# Patient Record
Sex: Female | Born: 1975
Health system: Southern US, Community
[De-identification: ages and names within clinical notes are randomized; demographics above are authoritative.]

---

## 2019-10-18 IMAGING — CT CT ANGIO CHEST
2 of 6 series · 19 of 36 positions shown · IV contrast (Omnipaque)
Comparison: [DATE]

CLINICAL DATA: Sepsis, left lower back pain

EXAM:
CT ANGIOGRAPHY CHEST WITH CONTRAST
TECHNIQUE: Multidetector CT imaging of the chest was performed using the
standard protocol during bolus administration of intravenous
contrast. Multiplanar CT image reconstructions and MIPs were
obtained to evaluate the vascular anatomy.
CONTRAST:  100mL OMNIPAQUE IOHEXOL 350 MG/ML SOLN

[Series 4: pe thins · axial · 0.69mm/px · z∈[-358,-76]mm · 18 of 314 slices shown]
[im 16/314  lung]
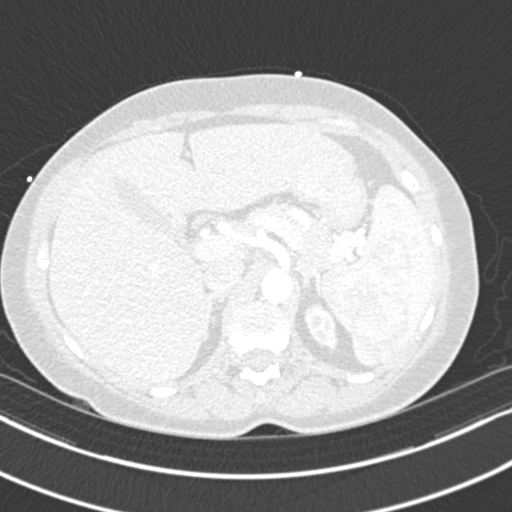
[im 32/314  mediastinal]
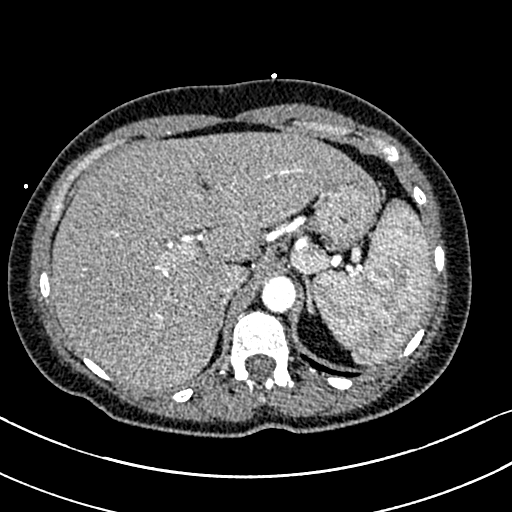
[im 47/314  lung]
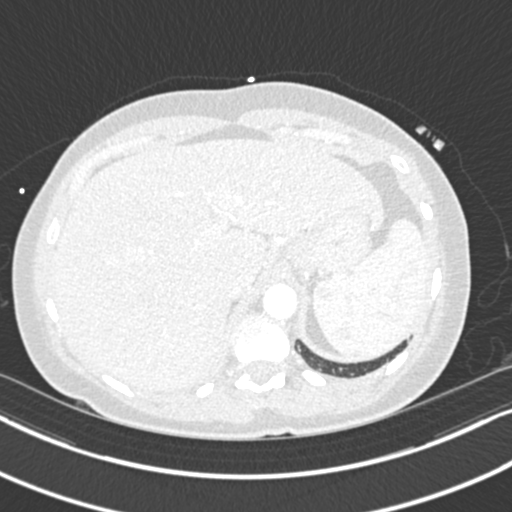
[im 63/314  mediastinal]
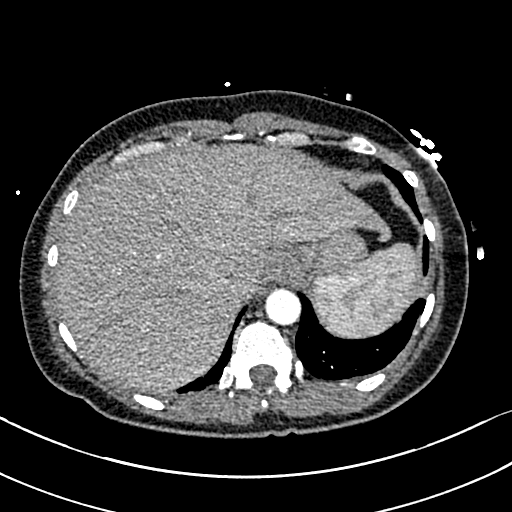
[im 79/314  lung]
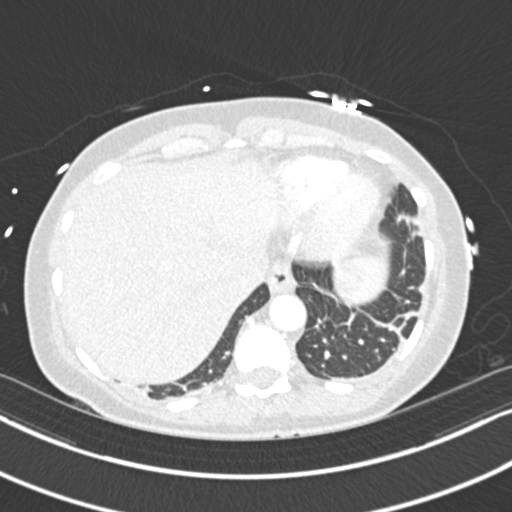
[im 94/314  mediastinal]
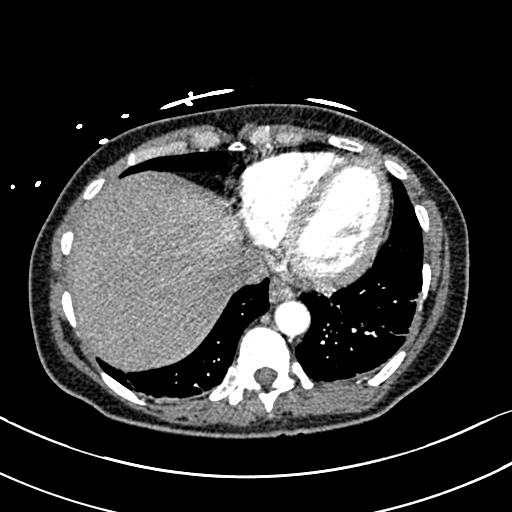
[im 110/314  lung]
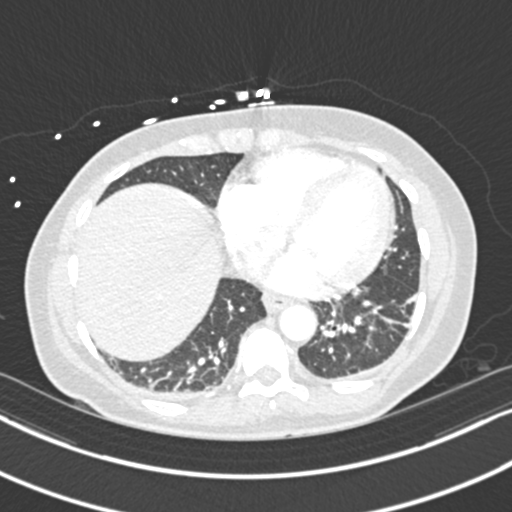
[im 126/314  mediastinal]
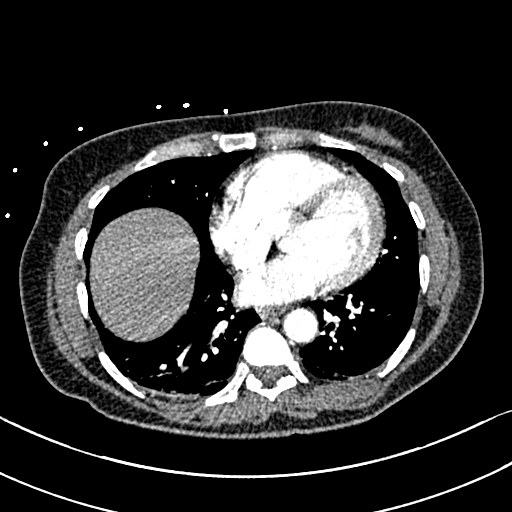
[im 141/314  lung]
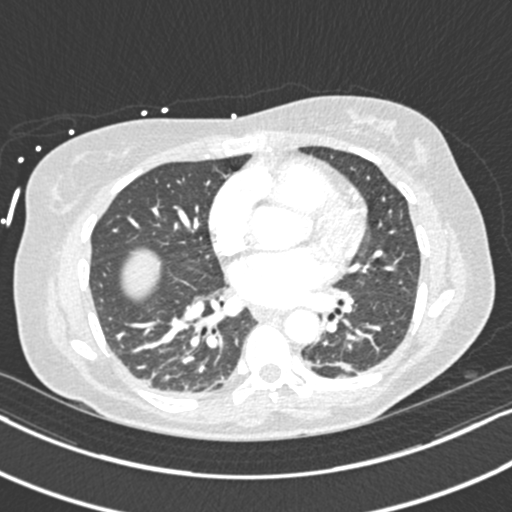
[im 173/314  mediastinal]
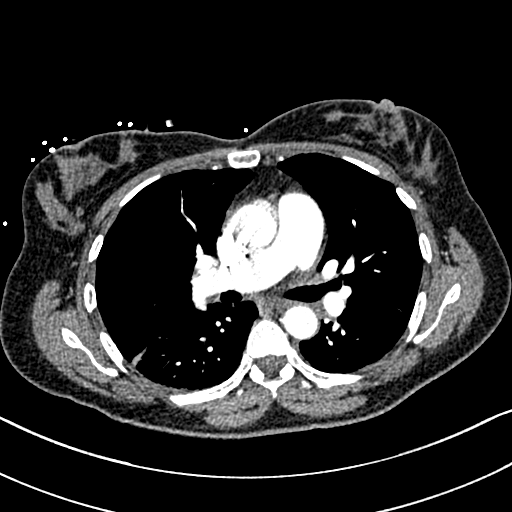
[im 188/314  lung]
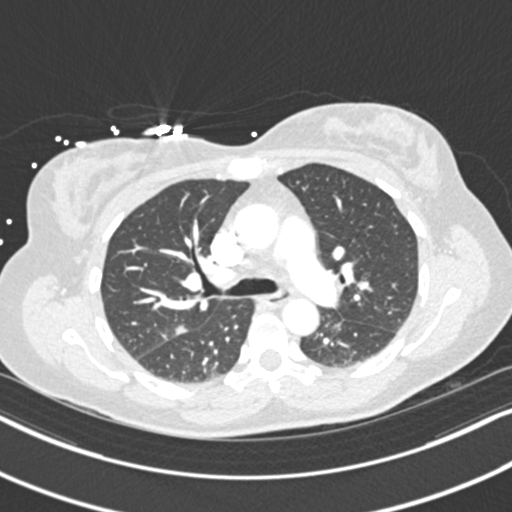
[im 204/314  mediastinal]
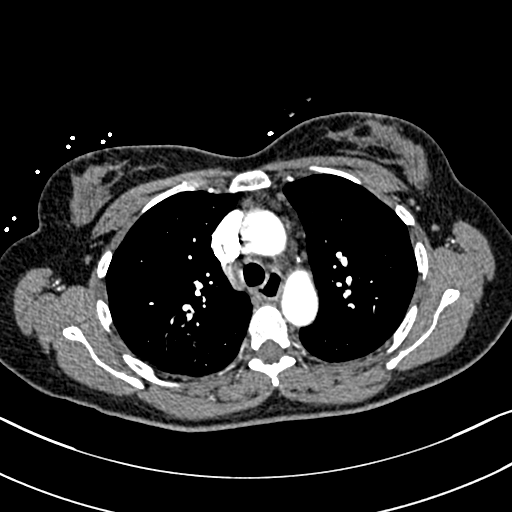
[im 220/314  lung]
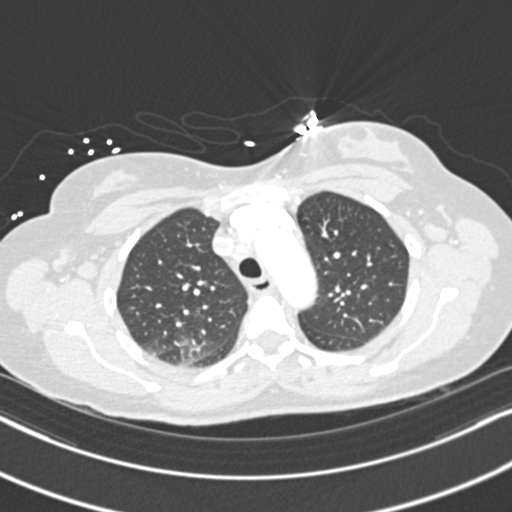
[im 235/314  mediastinal]
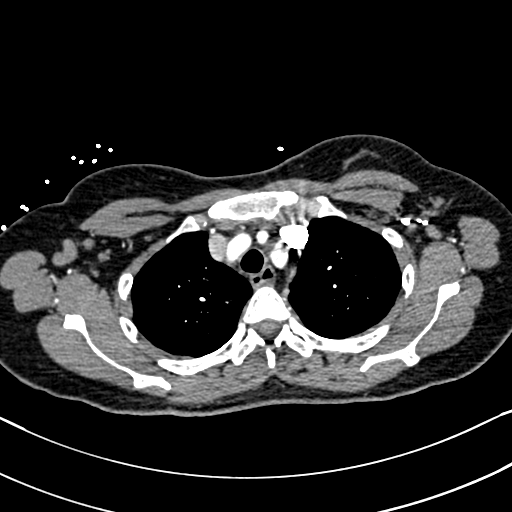
[im 251/314  lung]
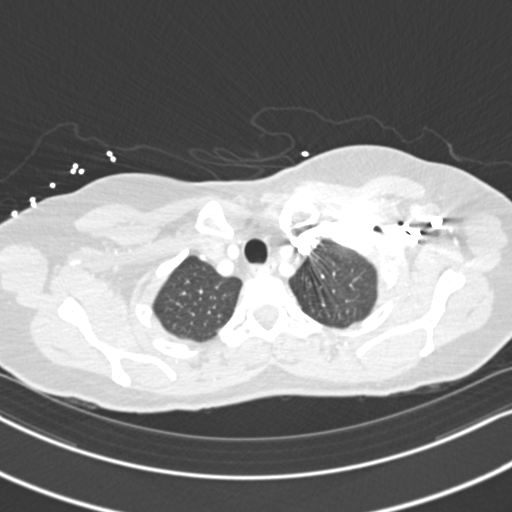
[im 267/314  mediastinal]
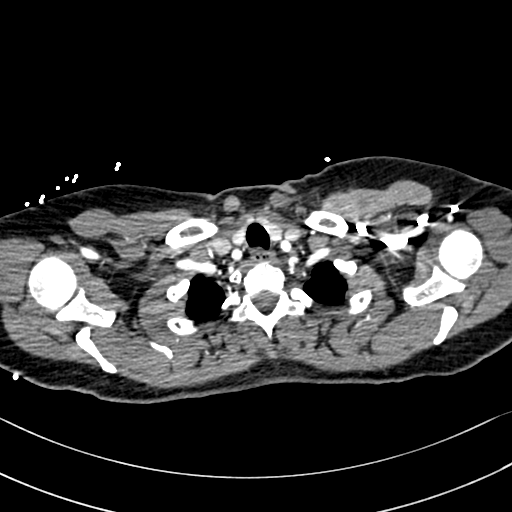
[im 282/314  lung]
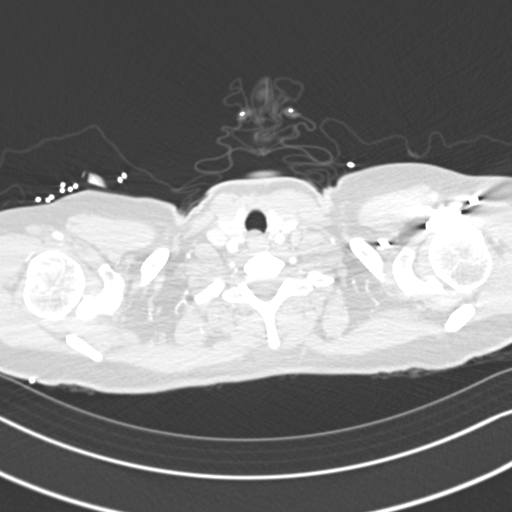
[im 298/314  mediastinal]
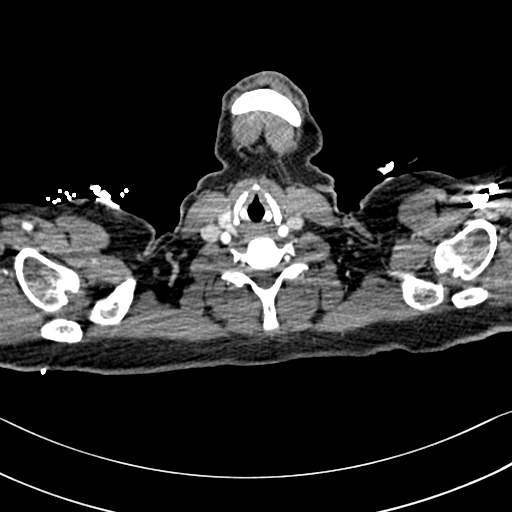

[Series 5: pe coronal mpr · coronal · 0.61mm/px · 1 of 125 slices shown]
[im 63/125  mediastinal]
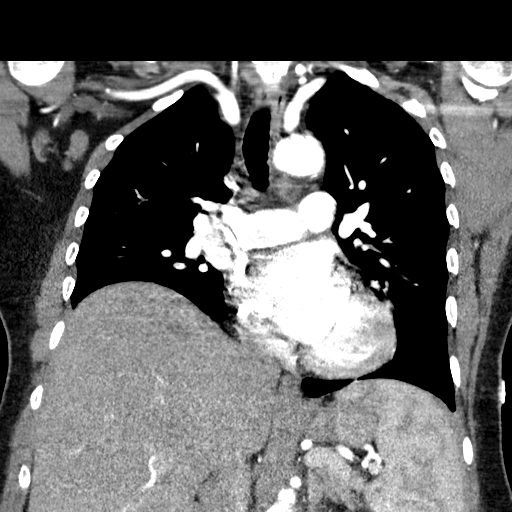

[19 of 36 positions shown; findings below may reference images not displayed]

FINDINGS: Cardiovascular: The heart is unremarkable without pericardial
effusion. Normal caliber of the thoracic aorta, with no evidence of
dissection. There are no filling defects or pulmonary emboli.

Mediastinum/Nodes: No enlarged mediastinal, hilar, or axillary lymph
nodes. Thyroid gland, trachea, and esophagus demonstrate no
significant findings.

Lungs/Pleura: There is patchy consolidation within the right upper
lobe along the major fissure, which could reflect hypoventilatory
change or focal infection/inflammation. Hypoventilatory changes are
seen at the lung bases. No effusion or pneumothorax. Central airways
are patent.

Upper Abdomen: No acute abnormality.

Musculoskeletal: No acute or destructive bony lesions. Reconstructed
images demonstrate no additional findings.

Review of the MIP images confirms the above findings.
IMPRESSION: 1. No evidence of pulmonary embolus.
2. Unremarkable thoracic aorta.
3. Minimal right upper lobe consolidation, which may reflect focal
inflammation/infection versus hypoventilatory change.

## 2019-10-18 IMAGING — MR MR THORACIC SPINE WO/W CM
5 of 9 series · 20 of 48 positions shown · IV contrast (gadavist)
Comparison: Prior CT from [DATE].

EXAM:
MRI THORACIC AND LUMBAR SPINE WITHOUT AND WITH CONTRAST
TECHNIQUE: Multiplanar and multiecho pulse sequences of the thoracic and lumbar
spine were obtained without and with intravenous contrast.

CONTRAST:  7.5mL GADAVIST GADOBUTROL 1 MMOL/ML IV SOLN

[Series 18: T1 · sagittal · 3.3mm · 0.62mm/px · 1 of 7 slices shown (1 of 3)]
[im 1/7]
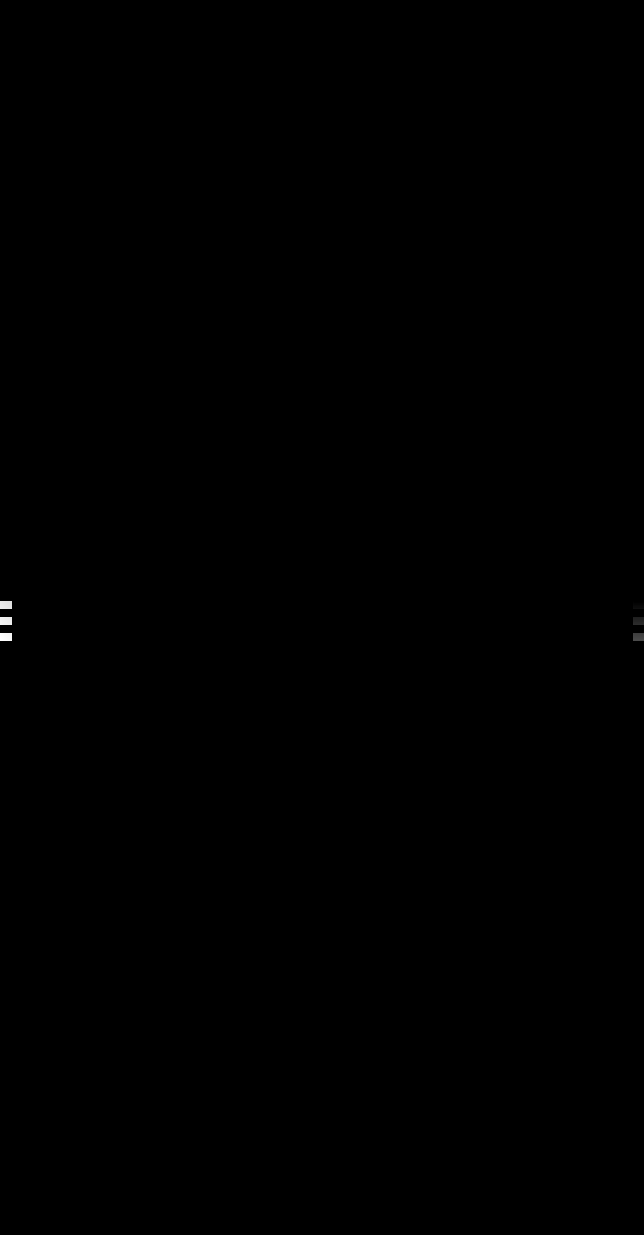

[Series 19: T2 · sagittal · 3.0mm · 0.76mm/px · 3 of 17 slices shown (1 of 2)]
[im 1/17]
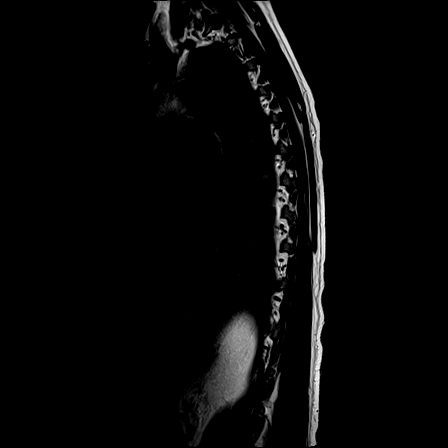
[im 9/17]
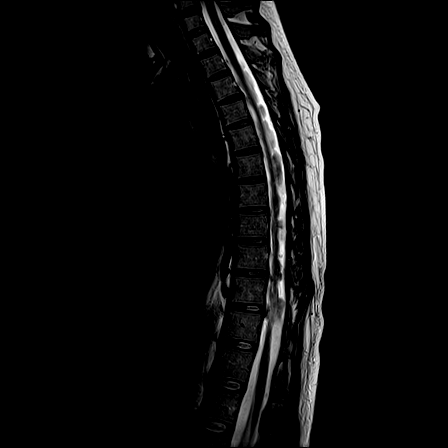
[im 17/17]
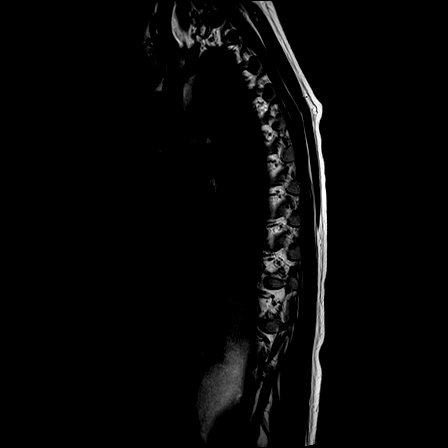

[Series 20: T1 · sagittal · 3.0mm · 0.76mm/px · 4 of 17 slices shown (2 of 3)]
[im 1/17]
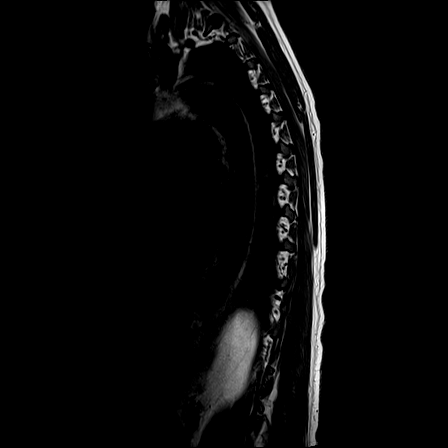
[im 6/17]
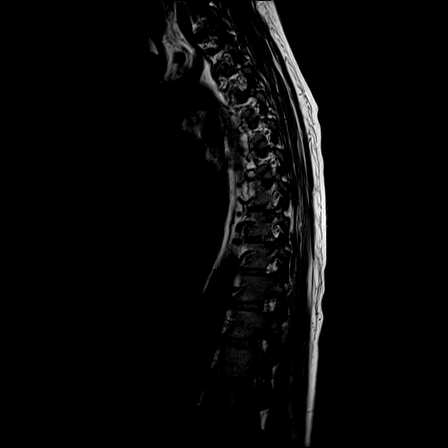
[im 11/17]
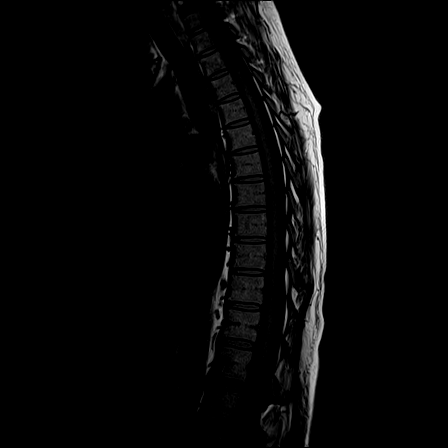
[im 17/17]
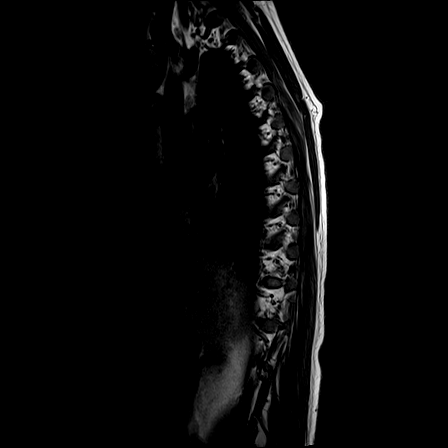

[Series 22: T2 · axial · 5.0mm · 0.59mm/px · z∈[-175,+57]mm · 8 of 39 slices shown (2 of 2)]
[im 1/39]
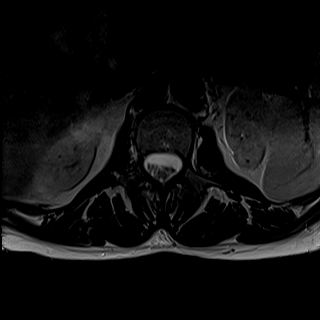
[im 6/39]
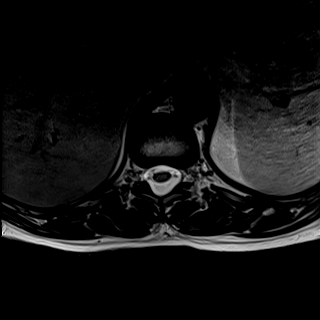
[im 11/39]
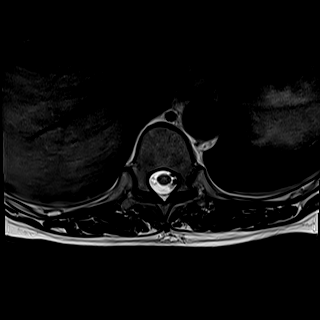
[im 17/39]
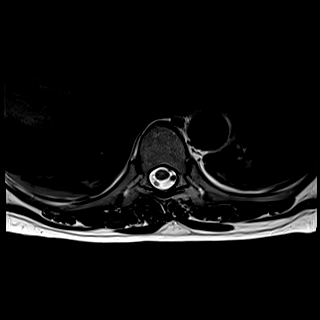
[im 22/39]
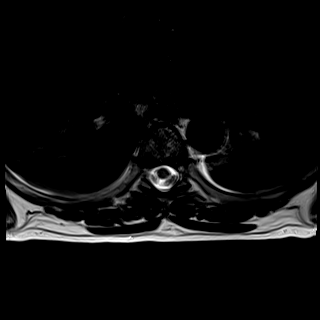
[im 28/39]
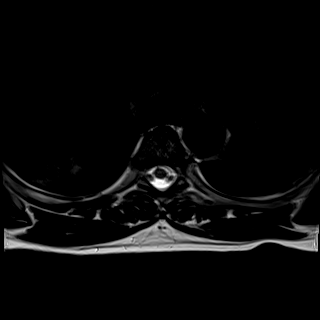
[im 33/39]
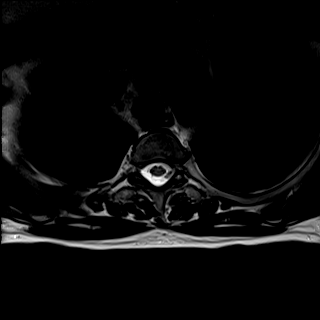
[im 39/39]
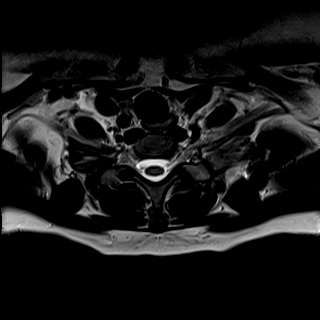

[Series 24: T1 · axial · non-contrast · 5.0mm · 0.31mm/px · z∈[-175,-47]mm · 4 of 39 slices shown (3 of 3)]
[im 1/39]
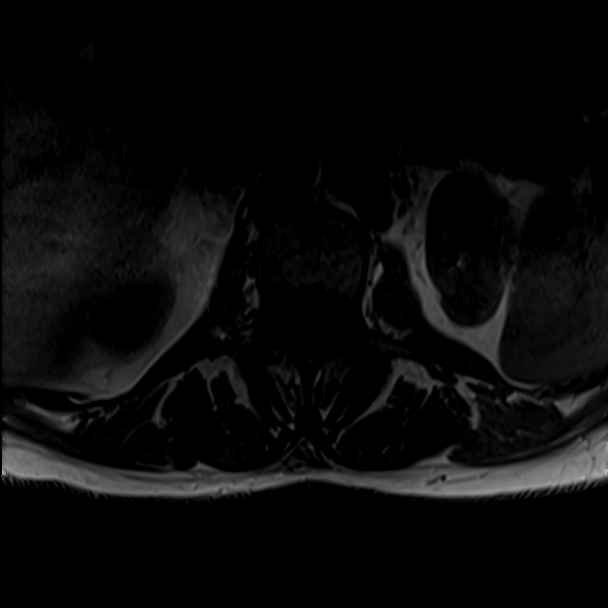
[im 6/39]
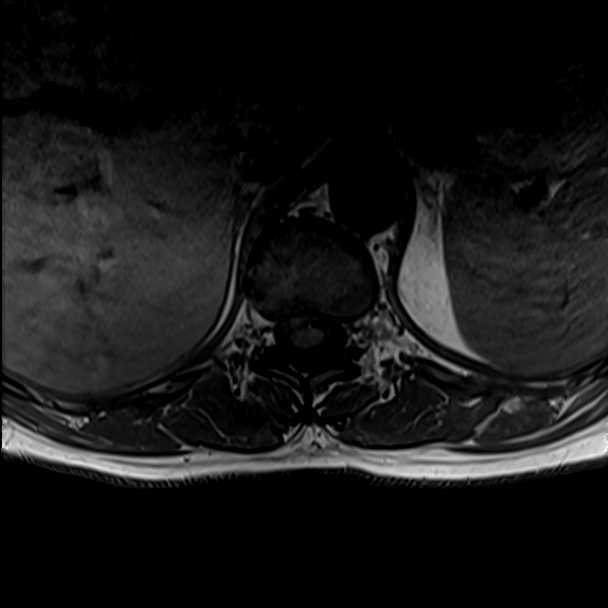
[im 11/39]
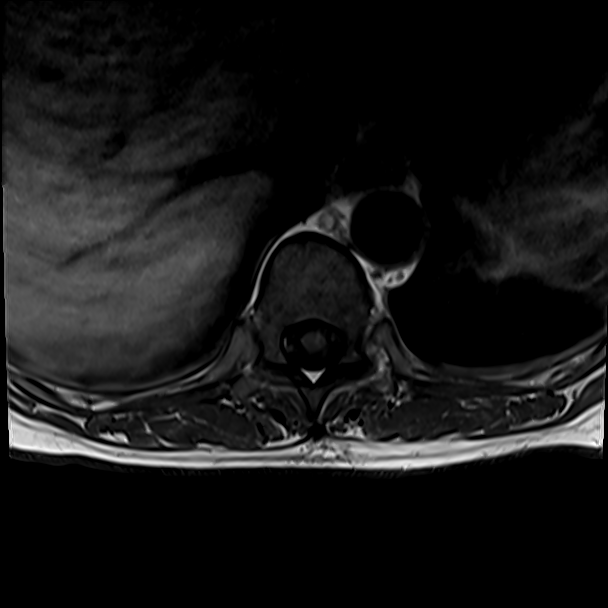
[im 17/39]
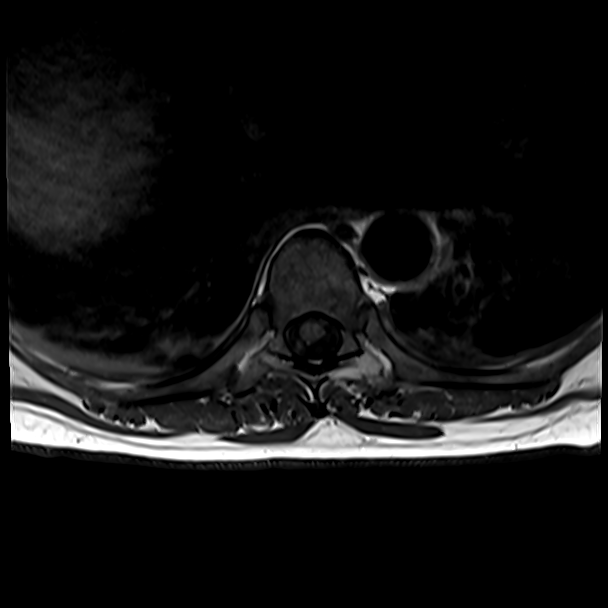

[20 of 48 positions shown; findings below may reference images not displayed]

FINDINGS: MRI THORACIC SPINE FINDINGS

Alignment: Physiologic with preservation of the normal thoracic
kyphosis. No listhesis.

Vertebrae: Vertebral body height maintained without acute or chronic
fracture. Bone marrow signal intensity within normal limits. No
discrete or worrisome osseous lesions. No abnormal marrow edema or
enhancement. No findings to suggest osteomyelitis discitis or septic
arthritis.

Cord: Normal signal and morphology. No epidural abscess or other
collection. No abnormal enhancement.

Paraspinal and other soft tissues: Paraspinous soft tissues
demonstrate no acute finding. Mild scattered atelectatic changes
noted within the visualized lungs. Associated trace layering
bilateral pleural effusions. 1 cm simple cyst noted within the
visualized left kidney. Visualized visceral structures otherwise
unremarkable.

Disc levels:

T3-4: Small central disc protrusion indents the ventral thecal sac.
No significant spinal stenosis or cord deformity.

No other significant disc pathology seen within the thoracic spine.
No other disc bulge or focal disc herniation. No significant
stenosis or evidence for neural impingement.

MRI LUMBAR SPINE FINDINGS

Segmentation: Standard. Lowest well-formed disc space labeled the
L5-S1 level.

Alignment:  Physiologic.  No listhesis.

Vertebrae: Vertebral body height maintained without acute or chronic
fracture. Abnormal marrow edema and enhancement seen about the left
sacroiliac joint, most consistent with septic arthritis given
provided history. Associated edema and enhancement within the
adjacent left iliacus and piriformis musculature. Small 1.3 cm early
abscess/collection seen along the anterior margin of the left SI
joint (series 4, image 27). No other so abscess or drainable fluid
collections identified.

No other evidence for acute infection within the lumbar spine.
Underlying bone marrow signal intensity within normal limits. No
worrisome osseous lesions.

Conus medullaris: Extends to the L1 level. Previously seen
thickening, clumping, and enhancement of the nerve roots of the
cauda equina is improved as compared to previous exam. However,
there has been interval development of multiple adhesive bands
traversing the distal thecal sac at the levels of L3 through the
sacrum. Mild enhancement seen along the most prominent of these
adhesive bands at the level of L5 (series 7, image 23), likely
reflecting fibrosis. Superiorly, the nerve roots of the cauda equina
are somewhat displaced posteriorly at the level of the conus,
similar to previous (series 4, image 5). Findings are likely
postinflammatory in nature related to prior infection.

Paraspinal and other soft tissues: Abnormal edema and enhancement
within the left iliacus markers filter adjacent to the left SI
joint. Mild edema and enhancement noted within the presacral space
as well. Paraspinous soft tissues demonstrate no other acute
finding. Few scattered simple cyst noted within the kidneys
bilaterally. Partially visualized urinary bladder is moderately
distended.

Disc levels:

L1-2:  Unremarkable.

L2-3:  Unremarkable.

L3-4: Disc desiccation with shallow central disc protrusion mildly
indents the ventral thecal sac. Associated small annular fissure. No
significant spinal stenosis. Foramina remain patent.

L4-5: Mild diffuse disc bulge with disc desiccation. Small central
annular fissure. Mild facet hypertrophy. No significant spinal
stenosis. Foramina remain patent.

L5-S1: Degenerative intervertebral disc space narrowing with disc
desiccation and reactive endplate changes. Prior laminotomy defect
noted on the right. No significant spinal stenosis. Foramina remain
patent.
IMPRESSION: 1. Findings consistent with septic arthritis involving the left
sacroiliac joint. Associated edema and enhancement within the
adjacent left iliacus and piriformis musculature with suspected
small 1.3 cm early periarticular abscess along the anterior margin
of the left SI joint.
2. No other evidence for acute infection within the thoracic or
lumbar spine. No epidural abscess or other collection.
3. Sequelae of prior infectious arachnoiditis with interval
development of multiple adhesive bands traversing the distal thecal
sac as above.
4. Mild degenerative disc disease at L3-4 through L5-S1 without
significant stenosis or neural impingement.

## 2019-10-18 IMAGING — DX DG CHEST 2V
2 series · 2 of 2 positions shown · non-contrast
Comparison: Chest radiograph [DATE] chest CT [DATE] at [facility]

CLINICAL DATA: Suspected sepsis.

EXAM:
CHEST - 2 VIEW

[chest lat]
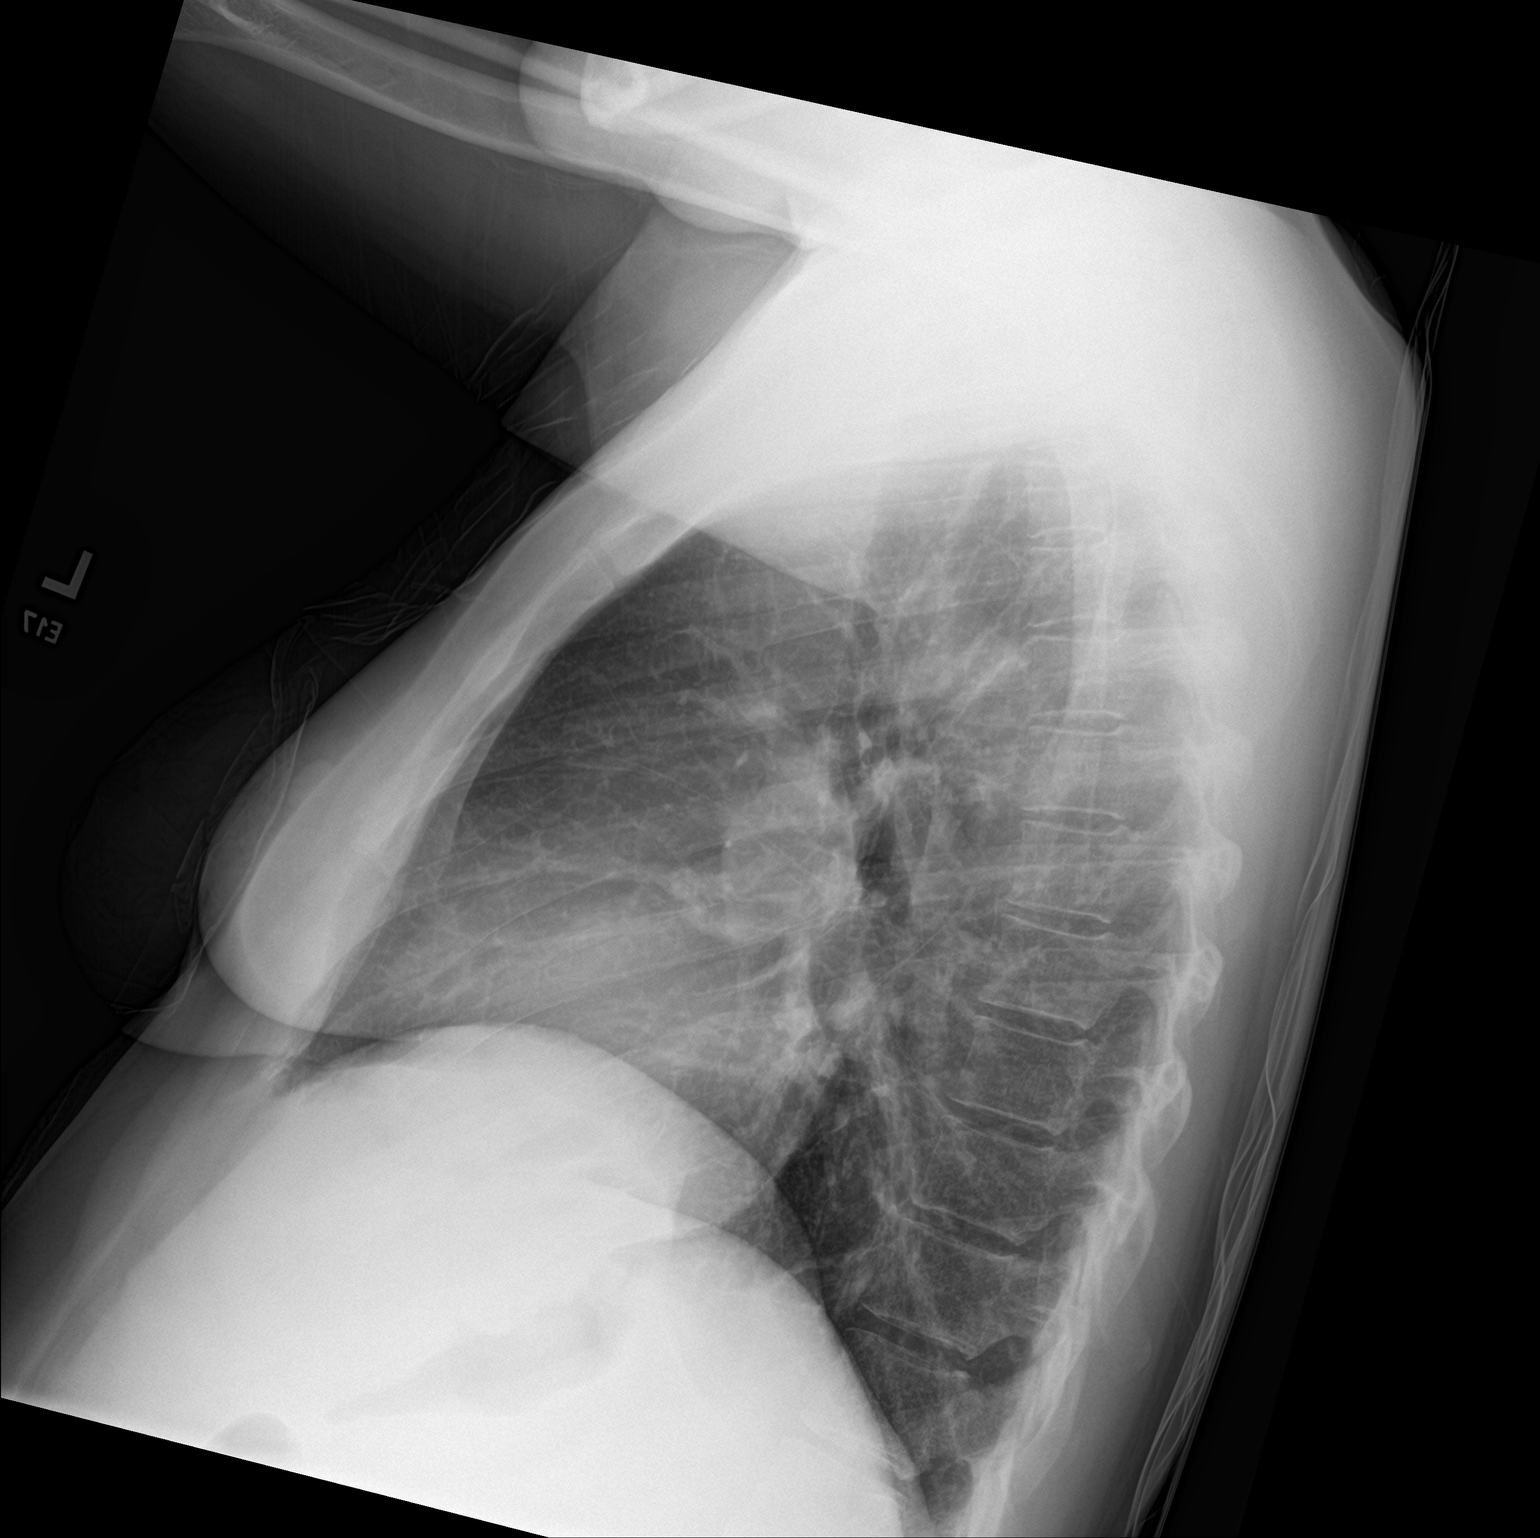

[chest ap]
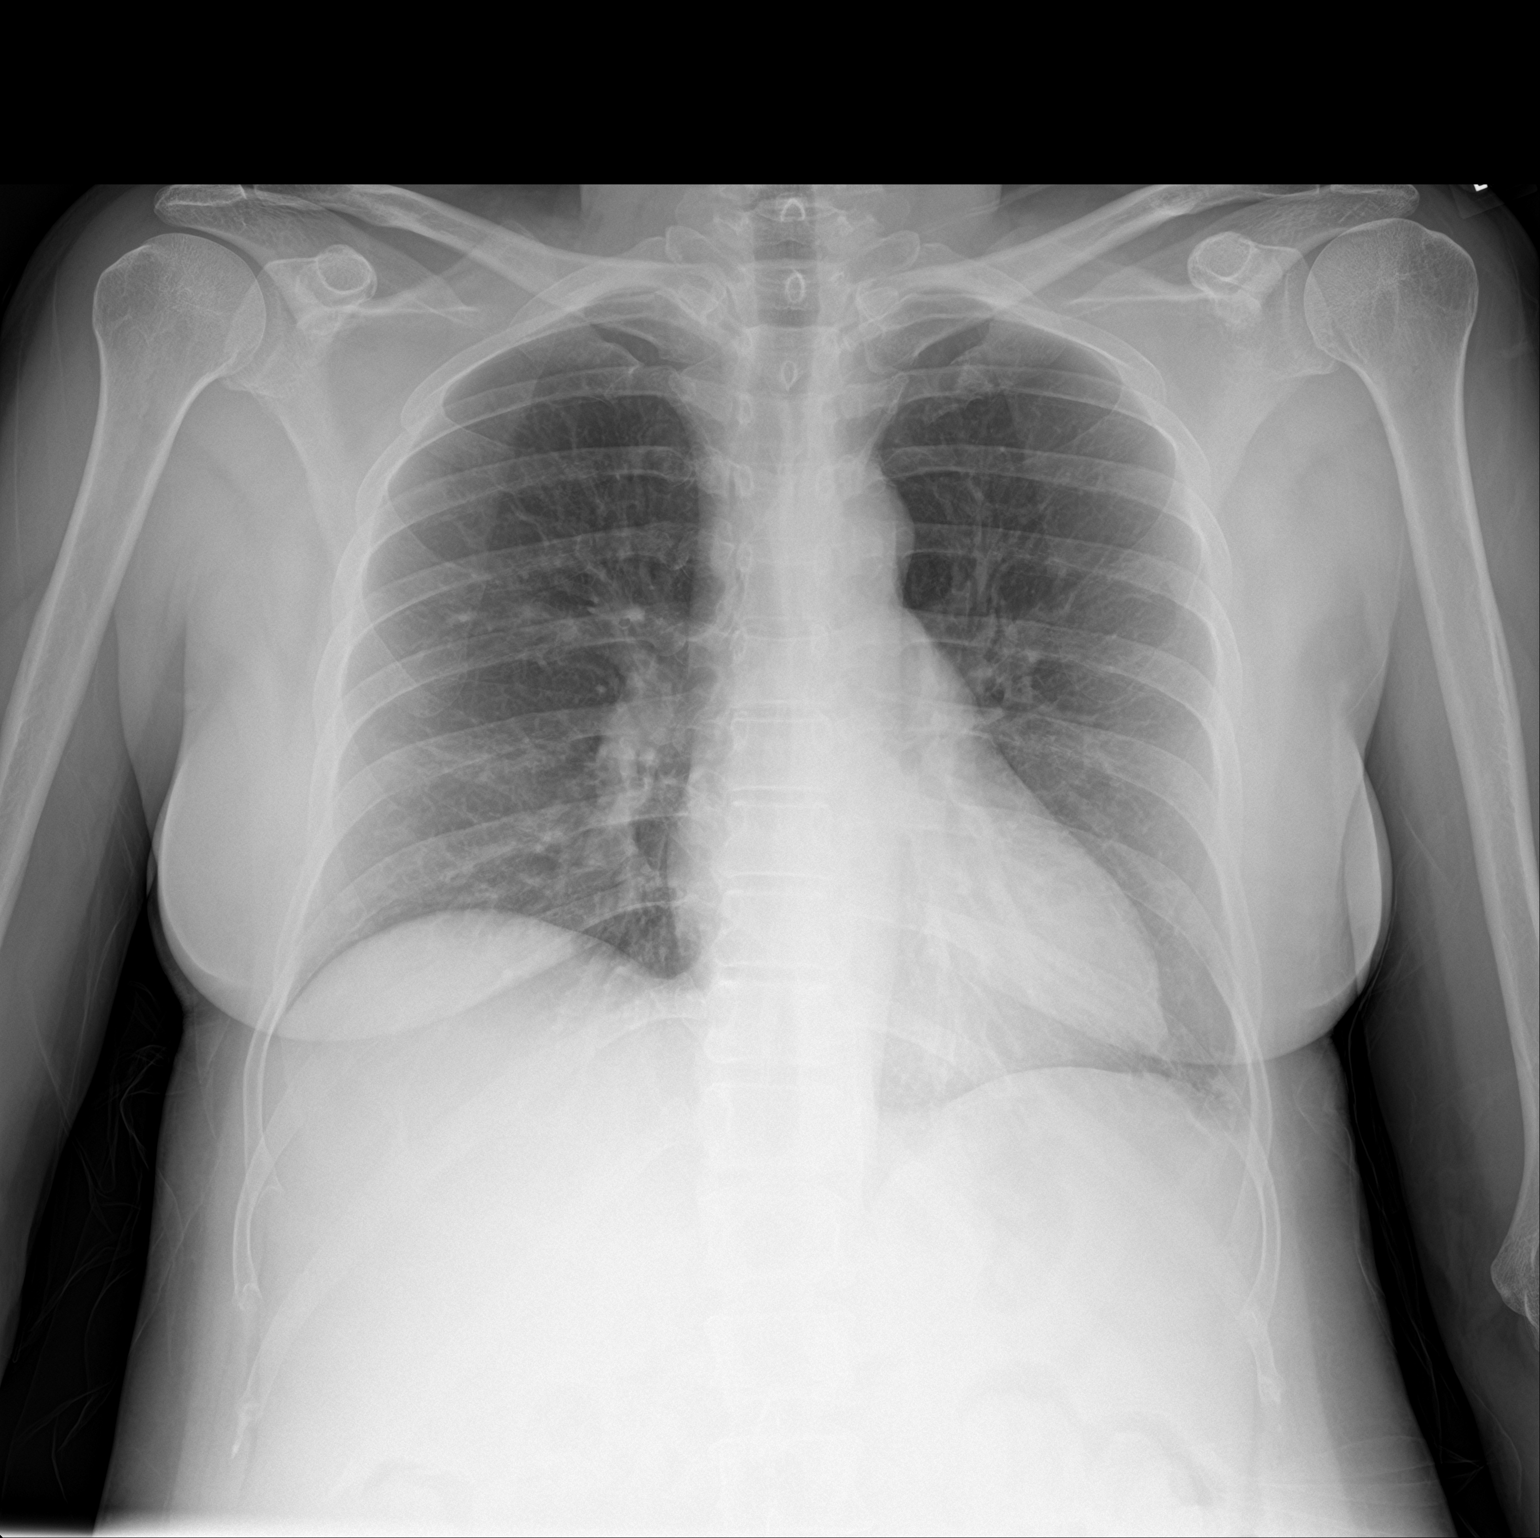

[2 of 2 positions shown; findings below may reference images not displayed]

FINDINGS: The cardiomediastinal contours are normal. Improved linear opacities
at the left lung base and lingula from prior exam. Pulmonary
vasculature is normal. No consolidation, pleural effusion, or
pneumothorax. No acute osseous abnormalities are seen.
IMPRESSION: No acute chest findings. Improved linear opacities in the left lung
base and lingula from prior exam.

## 2019-10-18 IMAGING — CT CT ABD-PELV W/ CM
2 of 7 series · 16 of 46 positions shown, 18 images · IV contrast (Omnipaque)
Comparison: [DATE]

CLINICAL DATA: Left lower quadrant abdominal pain, history of IV
drug abuse and Recurrent psoas abscess, low back pain with left
lower extremity radiculopathy

EXAM:
CT ABDOMEN AND PELVIS WITH CONTRAST
TECHNIQUE: Multidetector CT imaging of the abdomen and pelvis was performed
using the standard protocol following bolus administration of
intravenous contrast.
CONTRAST:  100mL OMNIPAQUE IOHEXOL 350 MG/ML SOLN

[Series 2: axial st · axial · 0.83mm/px · z∈[-690,-265]mm · 13 of 99 slices shown, 15 images]
[im 7/99  soft-tissue]
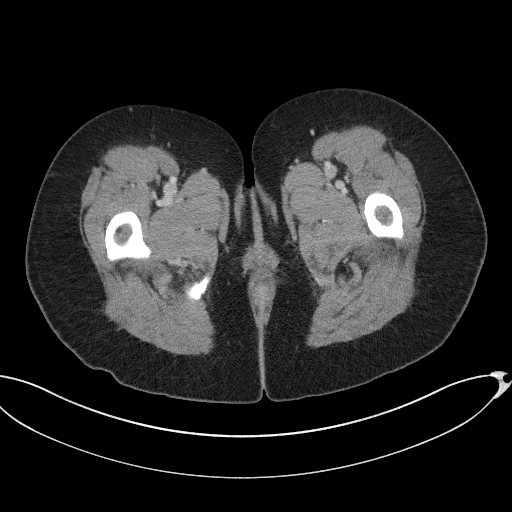
[im 7/99  bone]
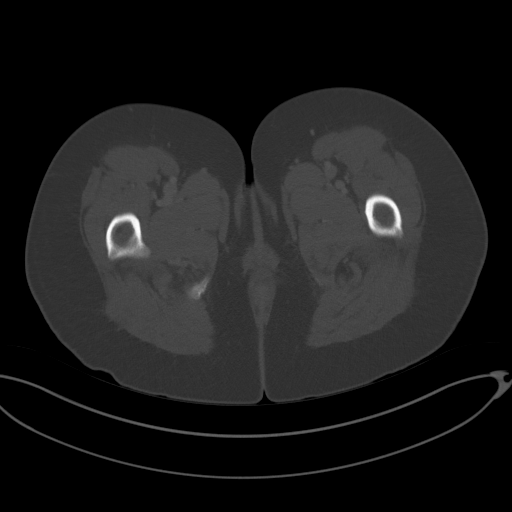
[im 13/99  soft-tissue]
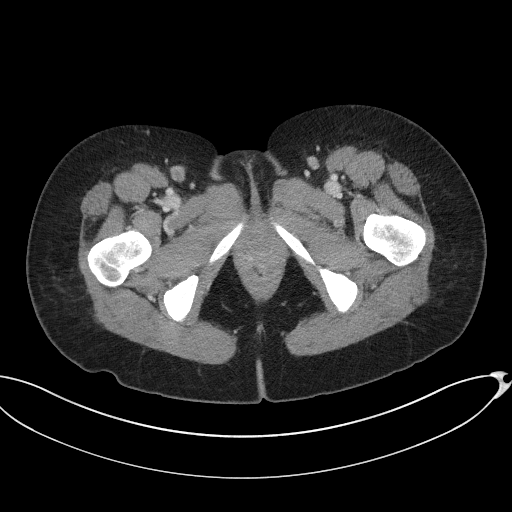
[im 19/99  soft-tissue]
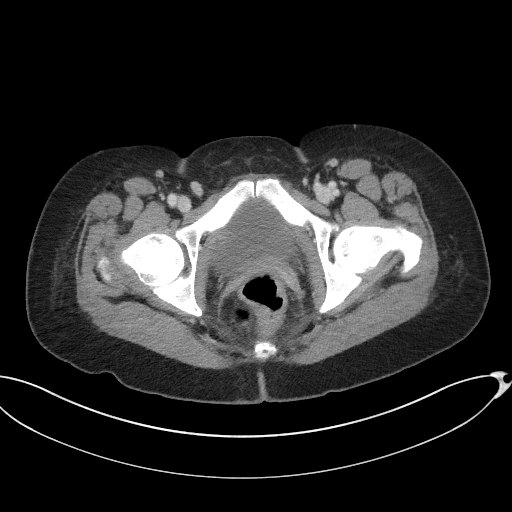
[im 31/99  soft-tissue]
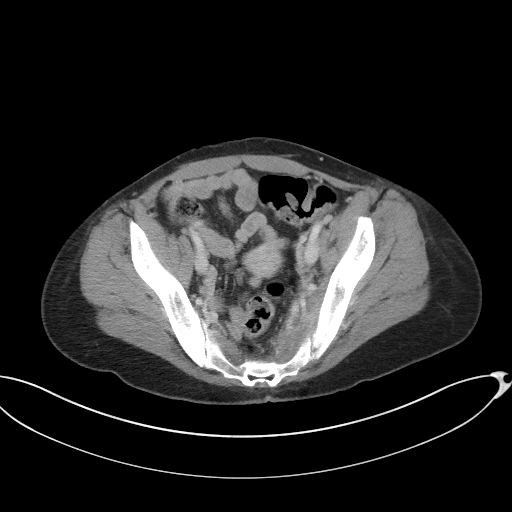
[im 37/99  soft-tissue]
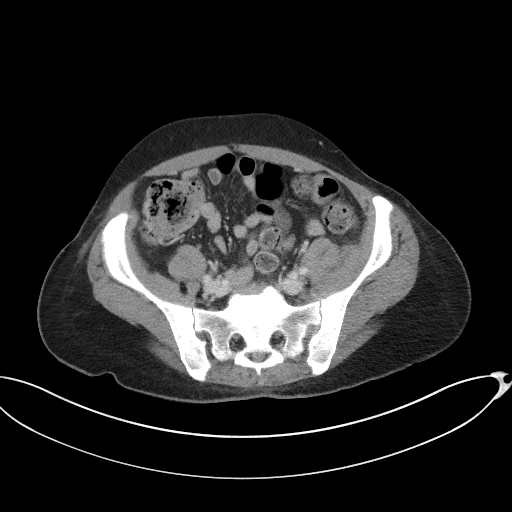
[im 43/99  soft-tissue]
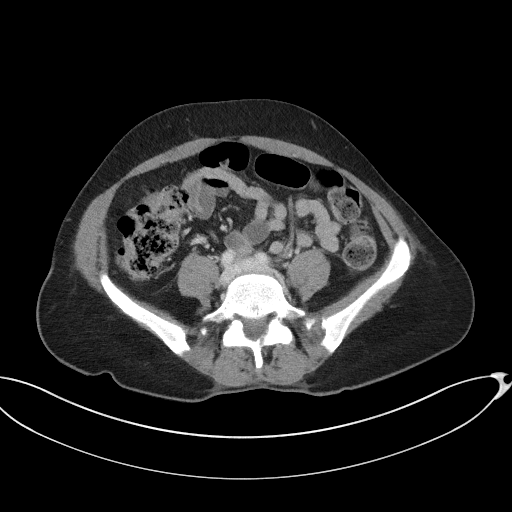
[im 50/99  soft-tissue]
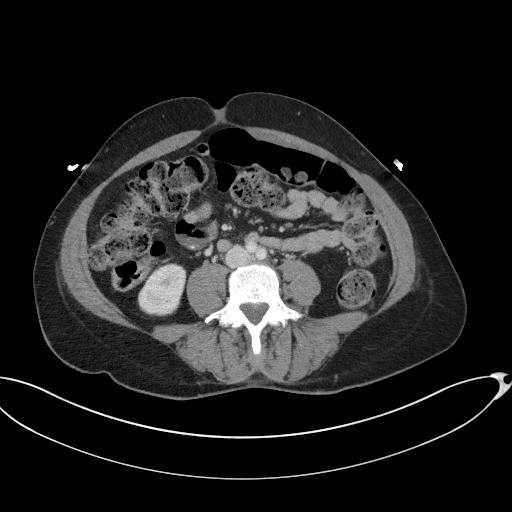
[im 56/99  soft-tissue]
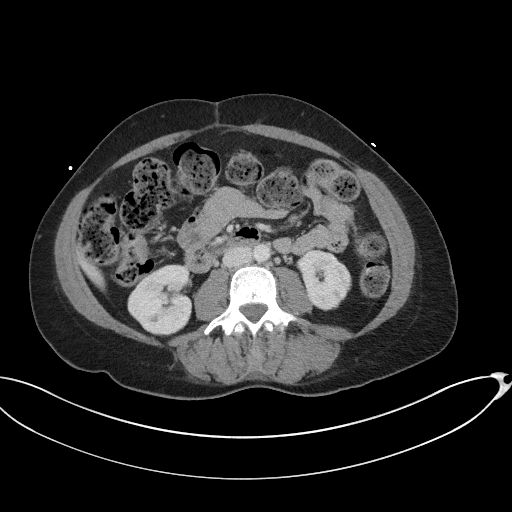
[im 62/99  soft-tissue]
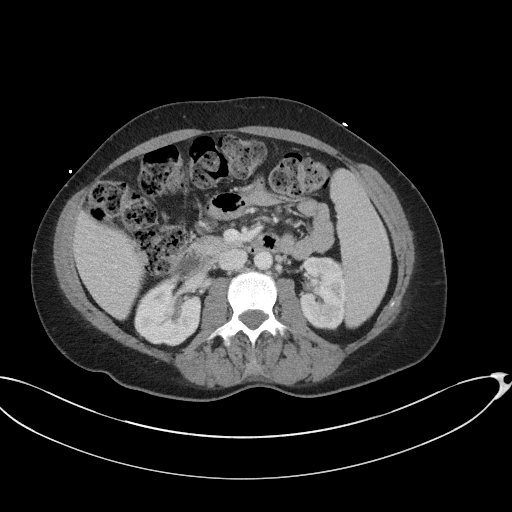
[im 62/99  bone]
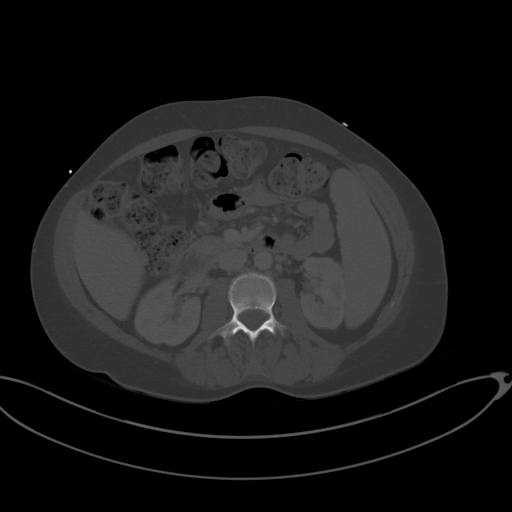
[im 68/99  soft-tissue]
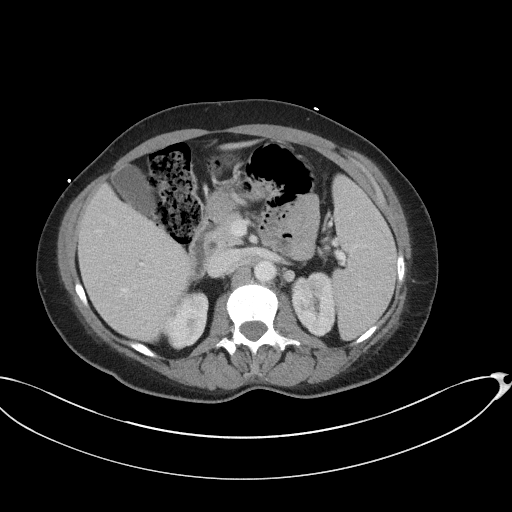
[im 80/99  soft-tissue]
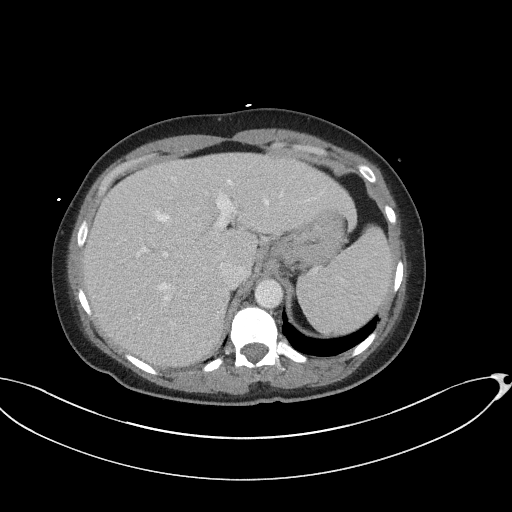
[im 86/99  soft-tissue]
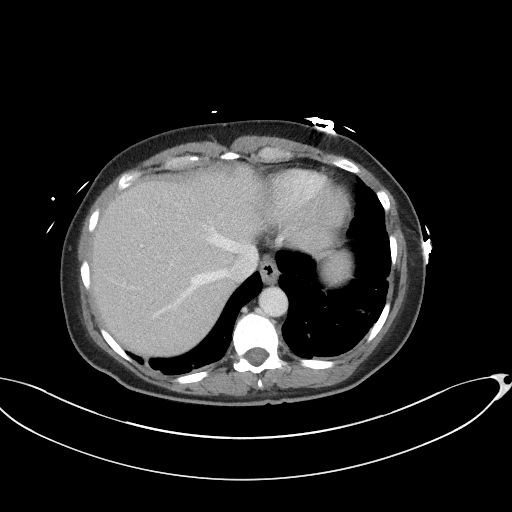
[im 92/99  soft-tissue]
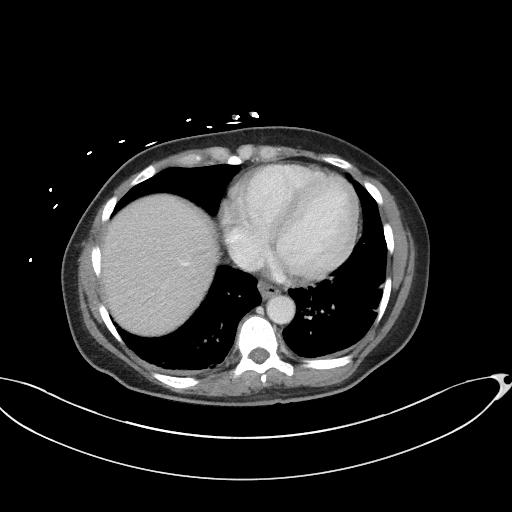

[Series 4: abd/pel coronal st · coronal · 0.76mm/px · 3 of 101 slices shown]
[im 34/101  soft-tissue]
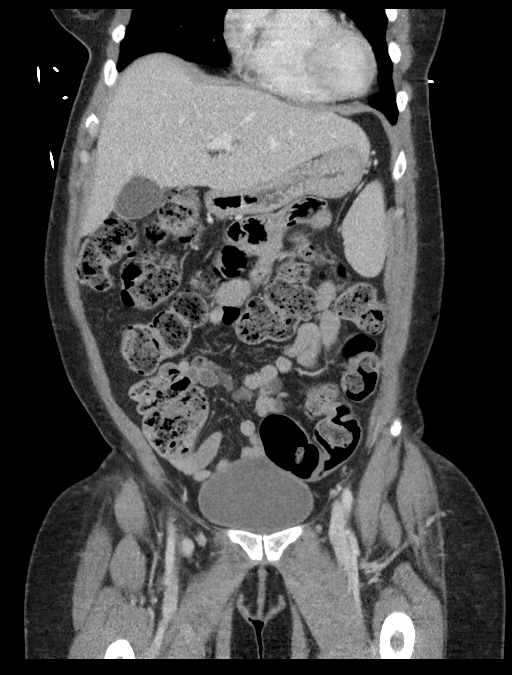
[im 51/101  soft-tissue]
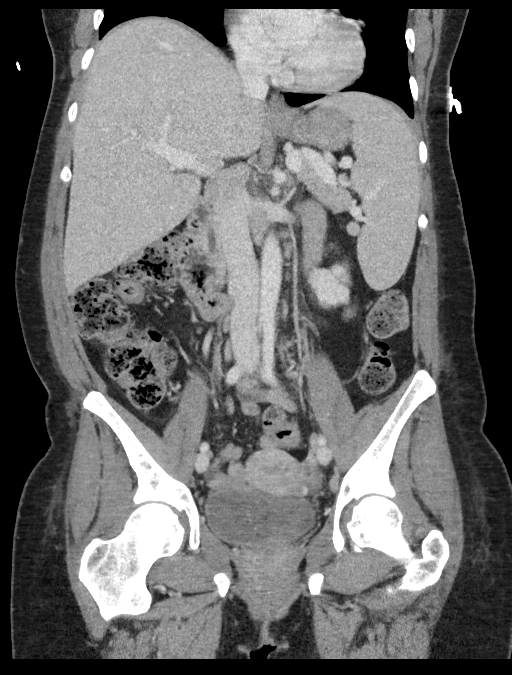
[im 67/101  soft-tissue]
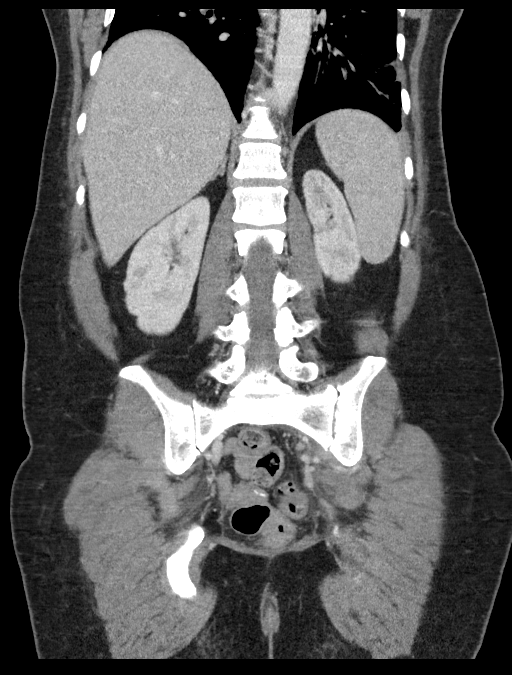

[16 of 46 positions shown; findings below may reference images not displayed]

FINDINGS: Lower chest: Hypoventilatory changes are seen at the lung bases.

Hepatobiliary: No focal liver abnormality is seen. No gallstones,
gallbladder wall thickening, or biliary dilatation.

Pancreas: Unremarkable. No pancreatic ductal dilatation or
surrounding inflammatory changes.

Spleen: The spleen is enlarged measuring nearly 15 cm in
craniocaudal length. No focal splenic abnormalities.

Adrenals/Urinary Tract: There is mild bilateral renal cortical
atrophy and has developed since previous exam. Small renal cortical
cysts are stable. No urinary tract calculi or obstruction. The
bladder is unremarkable. The adrenals are normal.

Stomach/Bowel: No bowel obstruction or ileus. Normal appendix right
lower quadrant. No bowel wall thickening or inflammatory change.
Moderate retained stool throughout the colon.

Vascular/Lymphatic: No significant vascular findings are present. No
enlarged abdominal or pelvic lymph nodes.

Reproductive: Uterus and bilateral adnexa are unremarkable.

Other: There is no free intraperitoneal fluid or free gas. No
abdominal wall hernia.

Musculoskeletal: No acute or destructive bony lesions. Paraspinal
soft tissues are unremarkable. Reconstructed images demonstrate no
additional findings.
IMPRESSION: 1. Splenomegaly.
2. Mild bilateral renal cortical atrophy, new since prior exam.
3. Otherwise no acute intra-abdominal or intrapelvic process.

## 2019-10-18 IMAGING — CT CT L SPINE W/O CM
3 of 6 series · 12 of 33 positions shown, 14 images · IV contrast (Omnipaque)
Comparison: [DATE], [DATE]

CLINICAL DATA: Low back pain, left lower extremity radiculopathy,
left lower quadrant pain

EXAM:
CT LUMBAR SPINE WITHOUT CONTRAST
TECHNIQUE: Multidetector CT imaging of the lumbar spine was performed without
intravenous contrast administration. Multiplanar CT image
reconstructions were also generated.

[Series 8: axial st · axial · 0.37mm/px · z∈[-527,-347]mm · 4 of 55 slices shown, 5 images]
[im 10/55  soft-tissue]
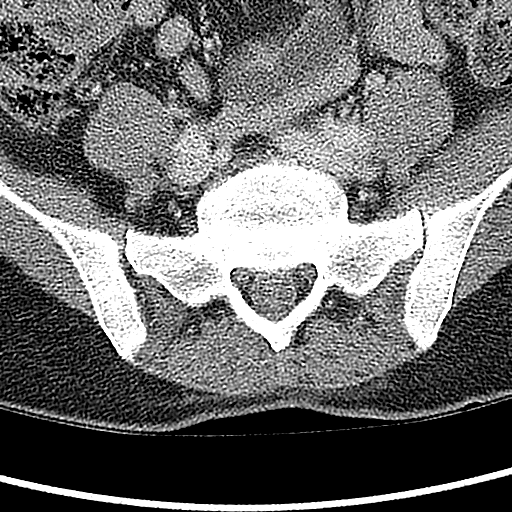
[im 10/55  bone]
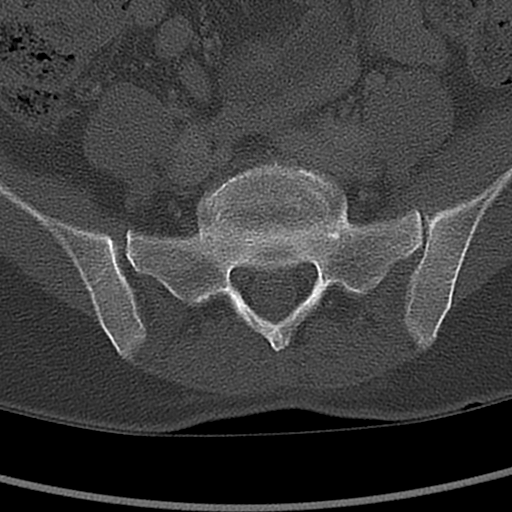
[im 19/55  bone]
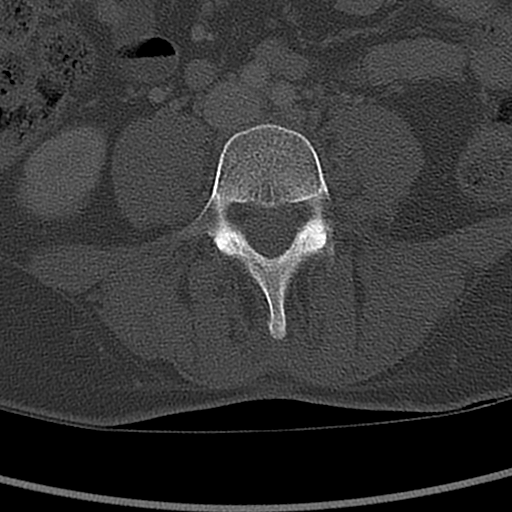
[im 37/55  bone]
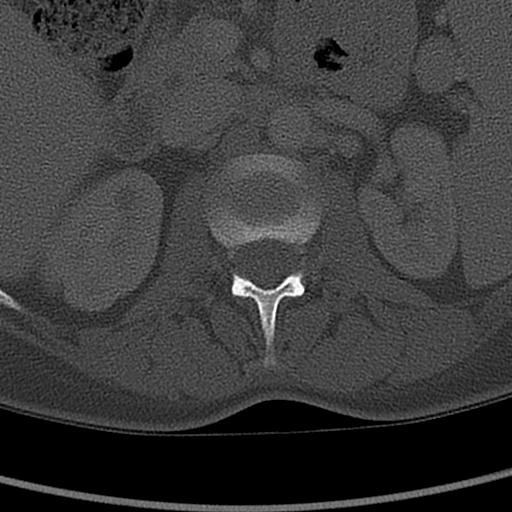
[im 46/55  bone]
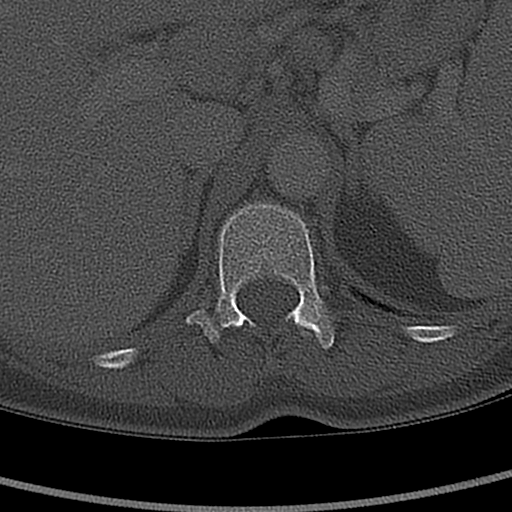

[Series 9: cor lspine · coronal · 0.31mm/px · 3 of 37 slices shown]
[im 8/37  bone]
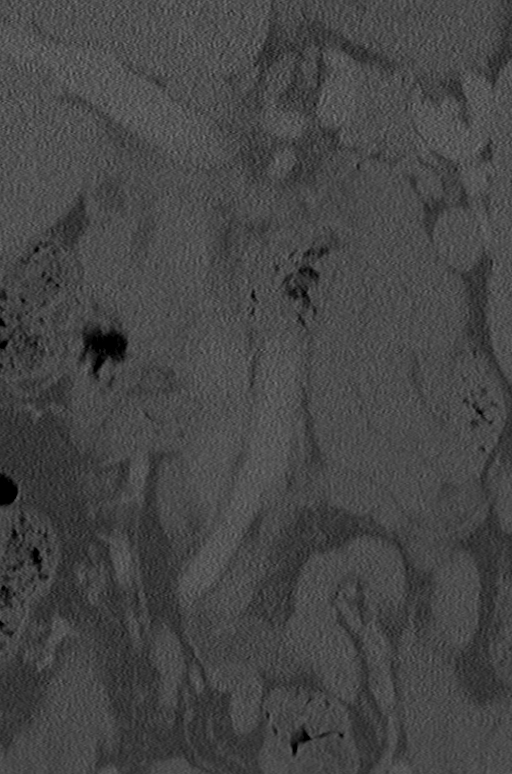
[im 15/37  bone]
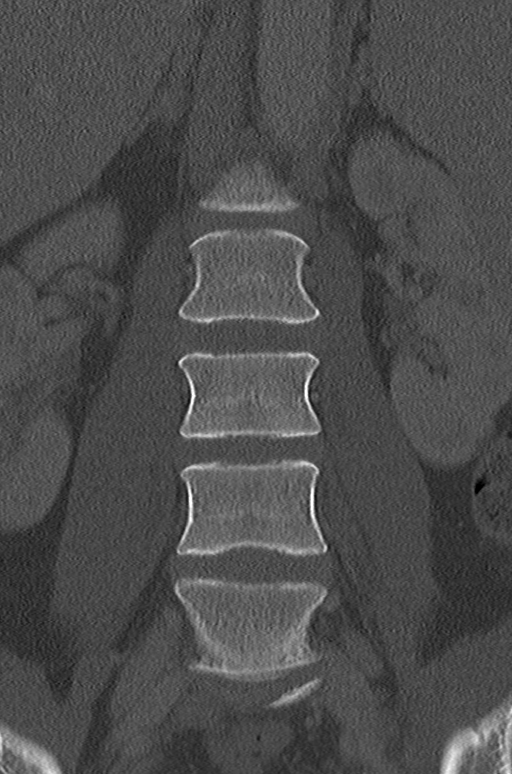
[im 22/37  bone]
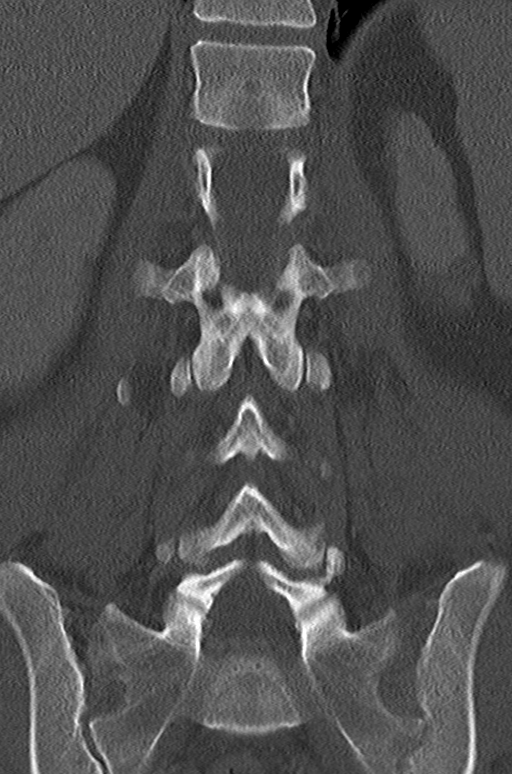

[Series 10: sag lspine · sagittal · 0.32mm/px · 5 of 46 slices shown, 6 images]
[im 16/46  bone]
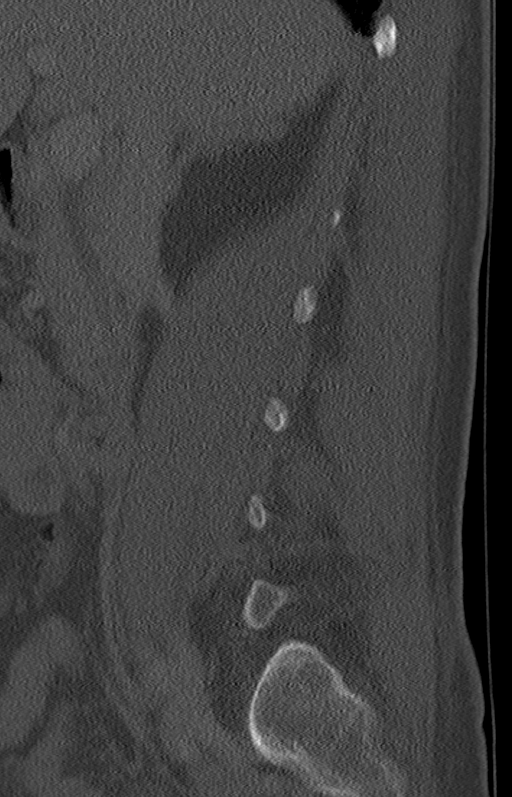
[im 19/46  bone]
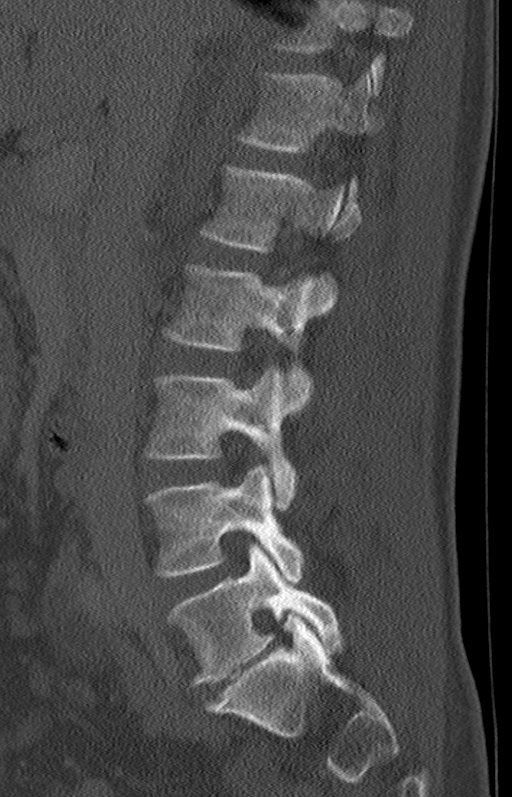
[im 23/46  soft-tissue]
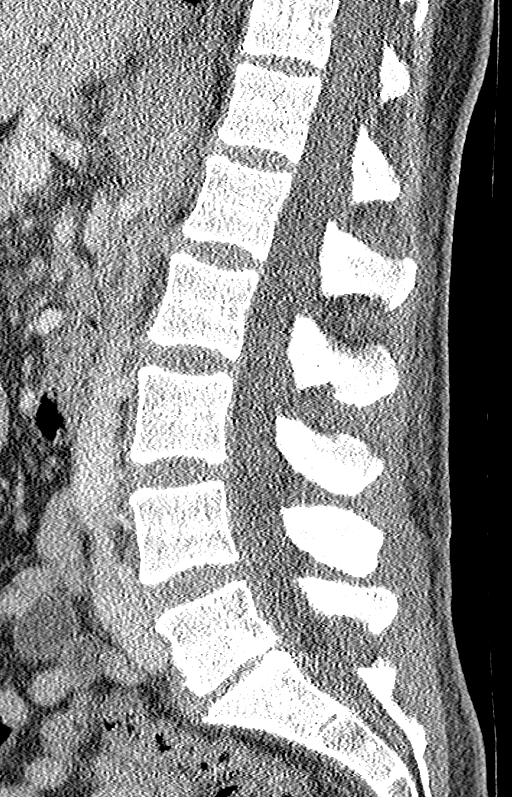
[im 23/46  bone]
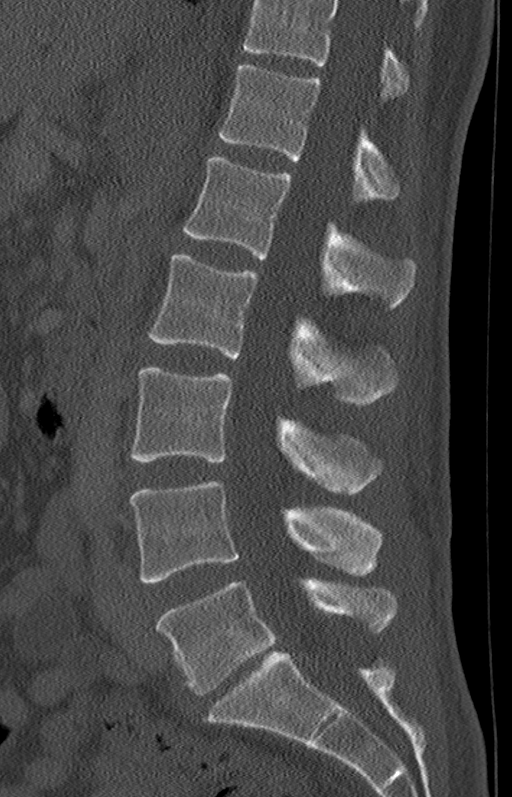
[im 27/46  bone]
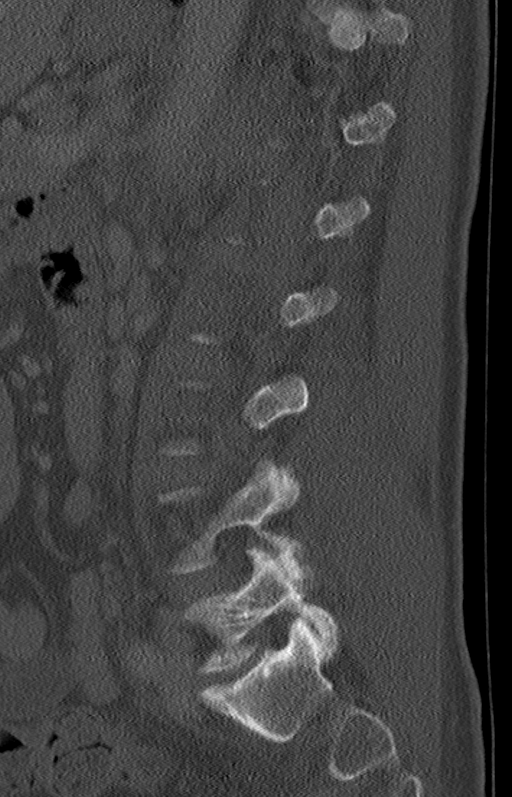
[im 31/46  bone]
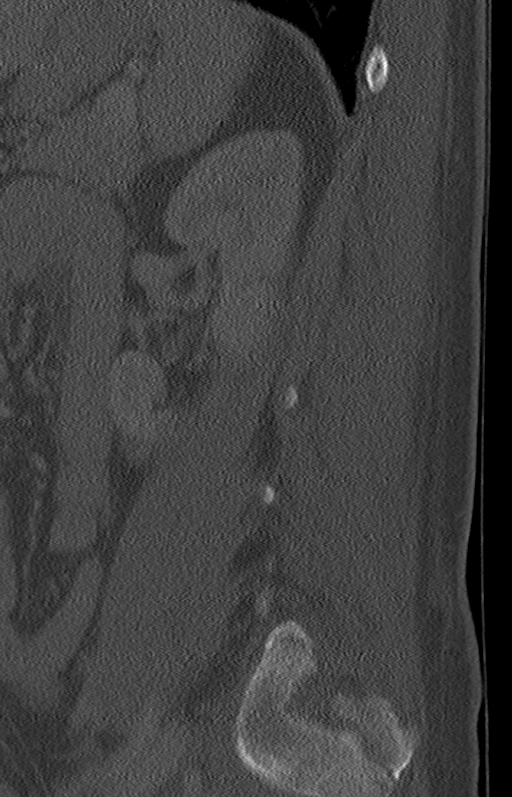

[12 of 33 positions shown; findings below may reference images not displayed]

FINDINGS: Segmentation: 5 lumbar type vertebrae.

Alignment: Normal.

Vertebrae: No acute or destructive bony lesions. Specifically, no
evidence of fracture or osteomyelitis.

Paraspinal and other soft tissues: Negative.

Disc levels: There is stable spondylosis at the L5/S1 level, with
disc space narrowing and mild circumferential osteophyte formation.
There is prominent facet hypertrophy is well the L5/S1 level, with
bony encroachment upon the bilateral neural foramina.

Remaining levels are unremarkable.
IMPRESSION: 1. Stable spondylosis at L5/S1.
2. No acute or destructive bony lesion.

## 2019-10-18 IMAGING — MR MR LUMBAR SPINE WO/W CM
4 of 7 series · 22 of 48 positions shown · IV contrast (gadavist)
Comparison: Prior CT from [DATE].

EXAM:
MRI THORACIC AND LUMBAR SPINE WITHOUT AND WITH CONTRAST
TECHNIQUE: Multiplanar and multiecho pulse sequences of the thoracic and lumbar
spine were obtained without and with intravenous contrast.

CONTRAST:  7.5mL GADAVIST GADOBUTROL 1 MMOL/ML IV SOLN

[Series 1: T2 · sagittal · 4.0mm · 0.73mm/px · 4 of 15 slices shown (1 of 2)]
[im 1/15]
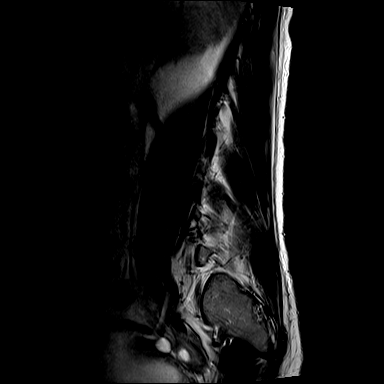
[im 5/15]
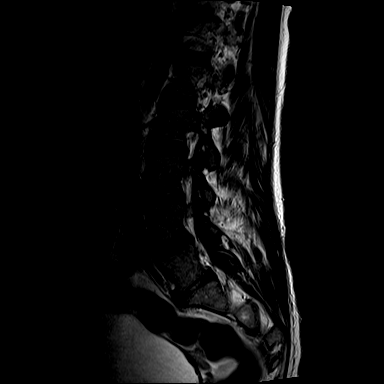
[im 10/15]
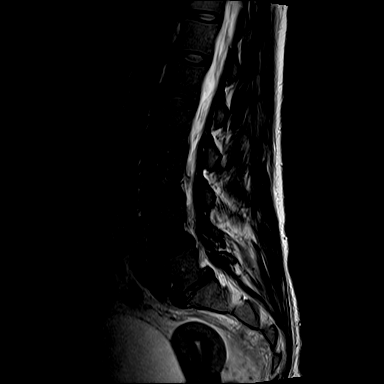
[im 15/15]
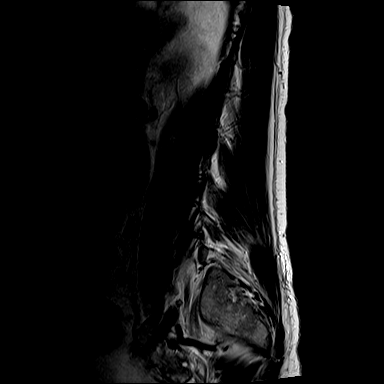

[Series 3: T1 · sagittal · 4.0mm · 0.88mm/px · 5 of 15 slices shown (1 of 2)]
[im 1/15]
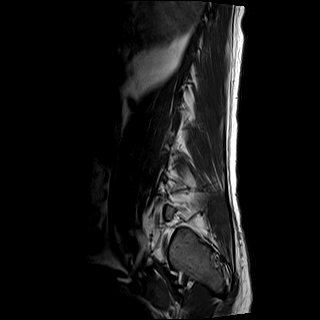
[im 4/15]
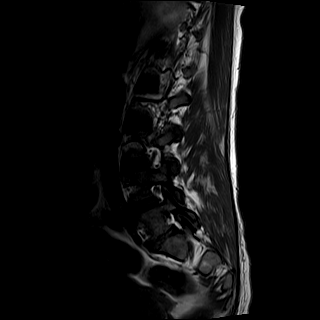
[im 8/15]
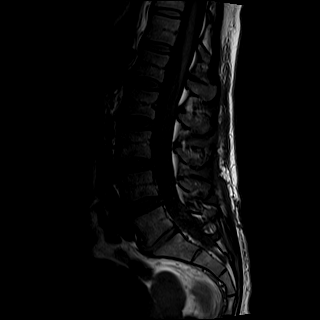
[im 11/15]
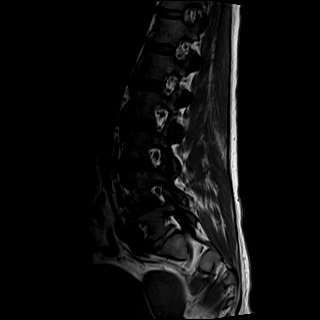
[im 15/15]
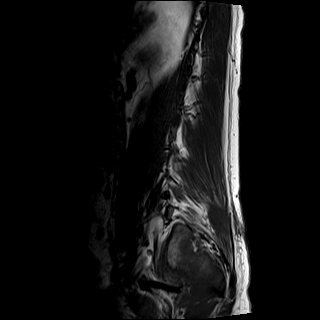

[Series 4: T2 · axial · 5.0mm · 0.57mm/px · z∈[-395,-144]mm · 8 of 31 slices shown (2 of 2)]
[im 1/31]
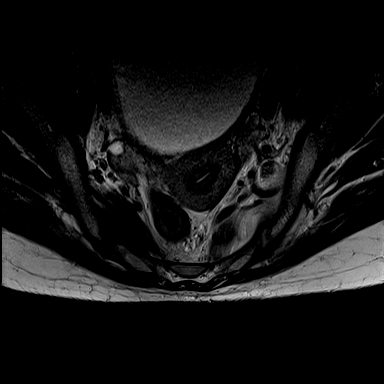
[im 4/31]
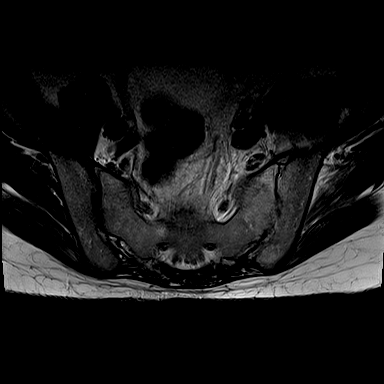
[im 11/31]
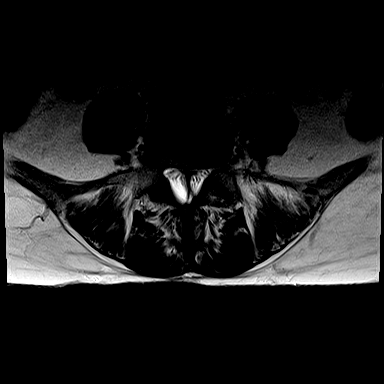
[im 14/31]
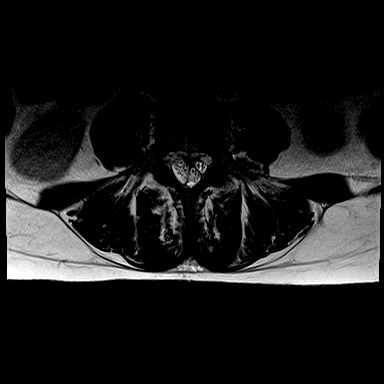
[im 17/31]
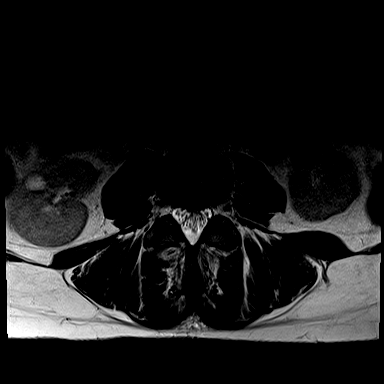
[im 21/31]
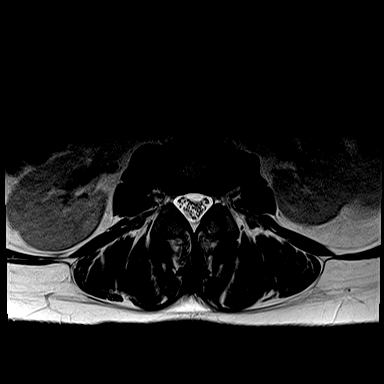
[im 27/31]
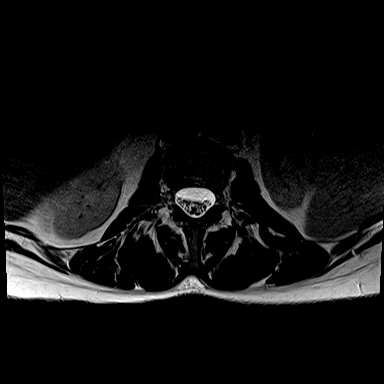
[im 31/31]
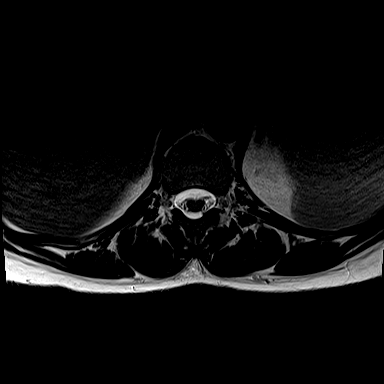

[Series 5: T1 · axial · 5.0mm · 0.34mm/px · z∈[-395,-173]mm · 5 of 31 slices shown (2 of 2)]
[im 1/31]
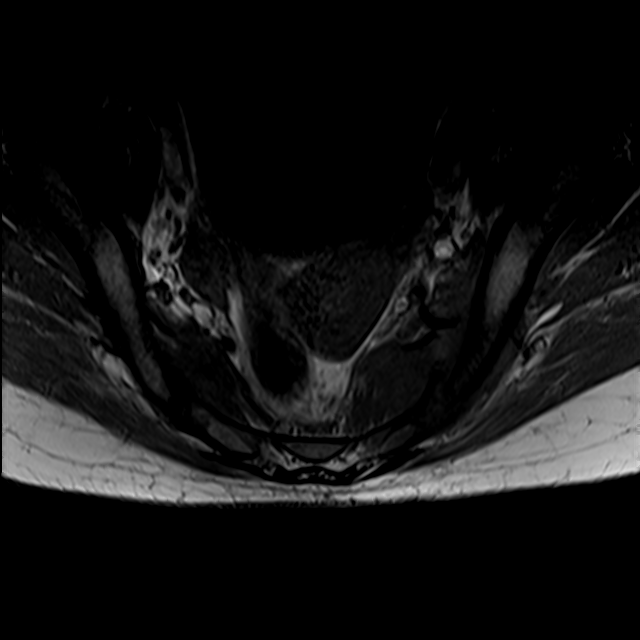
[im 4/31]
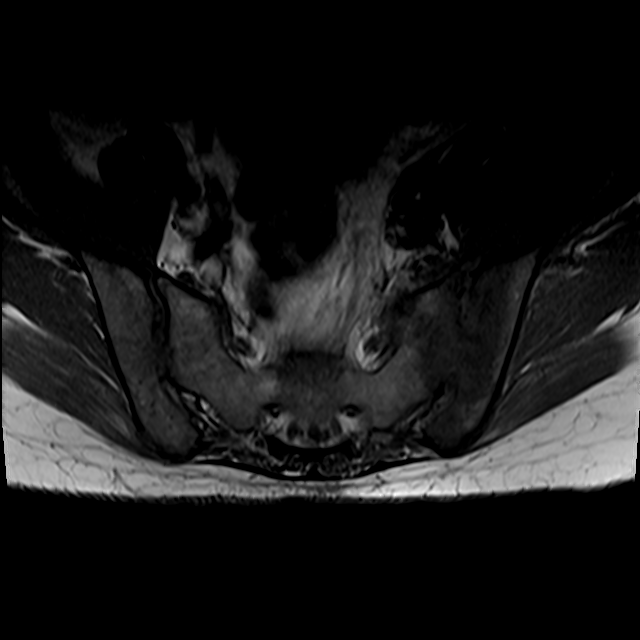
[im 11/31]
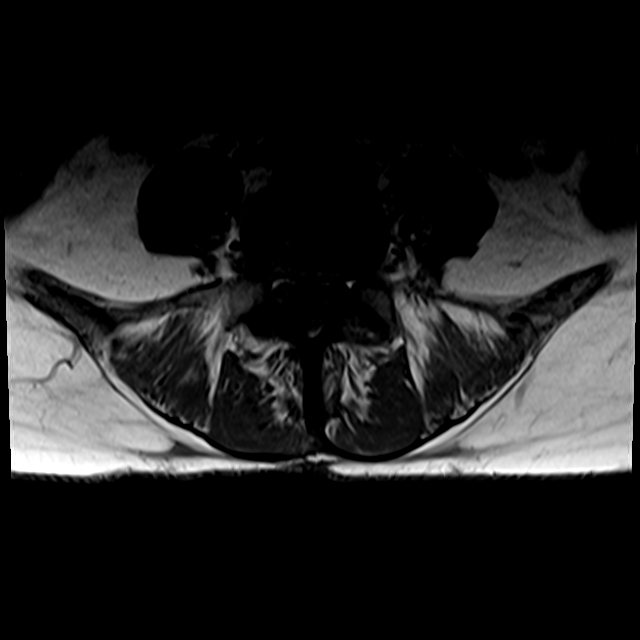
[im 17/31]
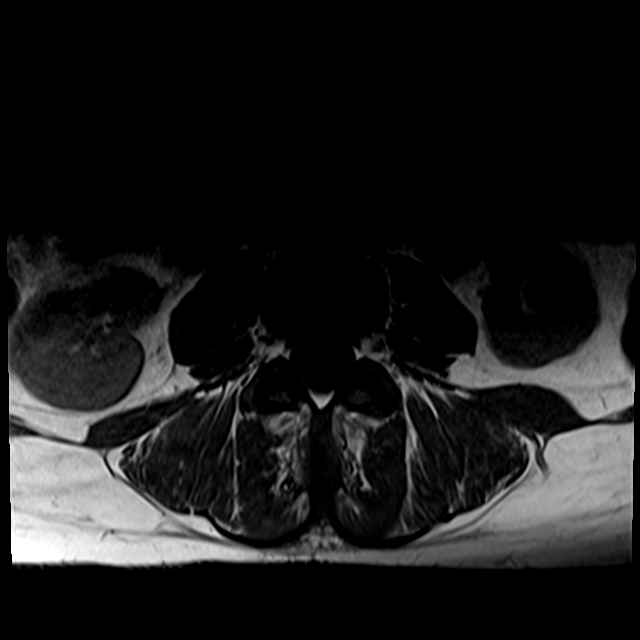
[im 27/31]
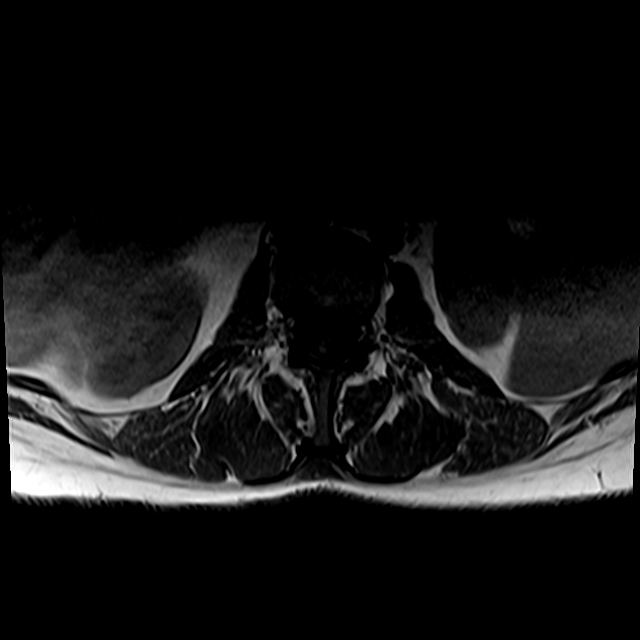

[22 of 48 positions shown; findings below may reference images not displayed]

FINDINGS: MRI THORACIC SPINE FINDINGS

Alignment: Physiologic with preservation of the normal thoracic
kyphosis. No listhesis.

Vertebrae: Vertebral body height maintained without acute or chronic
fracture. Bone marrow signal intensity within normal limits. No
discrete or worrisome osseous lesions. No abnormal marrow edema or
enhancement. No findings to suggest osteomyelitis discitis or septic
arthritis.

Cord: Normal signal and morphology. No epidural abscess or other
collection. No abnormal enhancement.

Paraspinal and other soft tissues: Paraspinous soft tissues
demonstrate no acute finding. Mild scattered atelectatic changes
noted within the visualized lungs. Associated trace layering
bilateral pleural effusions. 1 cm simple cyst noted within the
visualized left kidney. Visualized visceral structures otherwise
unremarkable.

Disc levels:

T3-4: Small central disc protrusion indents the ventral thecal sac.
No significant spinal stenosis or cord deformity.

No other significant disc pathology seen within the thoracic spine.
No other disc bulge or focal disc herniation. No significant
stenosis or evidence for neural impingement.

MRI LUMBAR SPINE FINDINGS

Segmentation: Standard. Lowest well-formed disc space labeled the
L5-S1 level.

Alignment:  Physiologic.  No listhesis.

Vertebrae: Vertebral body height maintained without acute or chronic
fracture. Abnormal marrow edema and enhancement seen about the left
sacroiliac joint, most consistent with septic arthritis given
provided history. Associated edema and enhancement within the
adjacent left iliacus and piriformis musculature. Small 1.3 cm early
abscess/collection seen along the anterior margin of the left SI
joint (series 4, image 27). No other so abscess or drainable fluid
collections identified.

No other evidence for acute infection within the lumbar spine.
Underlying bone marrow signal intensity within normal limits. No
worrisome osseous lesions.

Conus medullaris: Extends to the L1 level. Previously seen
thickening, clumping, and enhancement of the nerve roots of the
cauda equina is improved as compared to previous exam. However,
there has been interval development of multiple adhesive bands
traversing the distal thecal sac at the levels of L3 through the
sacrum. Mild enhancement seen along the most prominent of these
adhesive bands at the level of L5 (series 7, image 23), likely
reflecting fibrosis. Superiorly, the nerve roots of the cauda equina
are somewhat displaced posteriorly at the level of the conus,
similar to previous (series 4, image 5). Findings are likely
postinflammatory in nature related to prior infection.

Paraspinal and other soft tissues: Abnormal edema and enhancement
within the left iliacus markers filter adjacent to the left SI
joint. Mild edema and enhancement noted within the presacral space
as well. Paraspinous soft tissues demonstrate no other acute
finding. Few scattered simple cyst noted within the kidneys
bilaterally. Partially visualized urinary bladder is moderately
distended.

Disc levels:

L1-2:  Unremarkable.

L2-3:  Unremarkable.

L3-4: Disc desiccation with shallow central disc protrusion mildly
indents the ventral thecal sac. Associated small annular fissure. No
significant spinal stenosis. Foramina remain patent.

L4-5: Mild diffuse disc bulge with disc desiccation. Small central
annular fissure. Mild facet hypertrophy. No significant spinal
stenosis. Foramina remain patent.

L5-S1: Degenerative intervertebral disc space narrowing with disc
desiccation and reactive endplate changes. Prior laminotomy defect
noted on the right. No significant spinal stenosis. Foramina remain
patent.
IMPRESSION: 1. Findings consistent with septic arthritis involving the left
sacroiliac joint. Associated edema and enhancement within the
adjacent left iliacus and piriformis musculature with suspected
small 1.3 cm early periarticular abscess along the anterior margin
of the left SI joint.
2. No other evidence for acute infection within the thoracic or
lumbar spine. No epidural abscess or other collection.
3. Sequelae of prior infectious arachnoiditis with interval
development of multiple adhesive bands traversing the distal thecal
sac as above.
4. Mild degenerative disc disease at L3-4 through L5-S1 without
significant stenosis or neural impingement.

## 2019-10-27 IMAGING — CT CT PELVIS W/ CM
2 of 4 series · 16 of 46 positions shown, 18 images · IV contrast (omnipaque)
Comparison: [DATE] [DATE], [DATE].  [DATE] [DATE], [DATE].

CLINICAL DATA: Septic arthritis, anal abscess.

EXAM:
CT PELVIS WITH CONTRAST
TECHNIQUE: Multidetector CT imaging of the pelvis was performed using the
standard protocol following the bolus administration of intravenous
contrast.
CONTRAST:  100mL OMNIPAQUE IOHEXOL 350 MG/ML SOLN

[Series 4: pelvis with 1mm · axial · 0.92mm/px · z∈[+716,+958]mm · 13 of 264 slices shown, 15 images]
[im 11/264  soft-tissue]
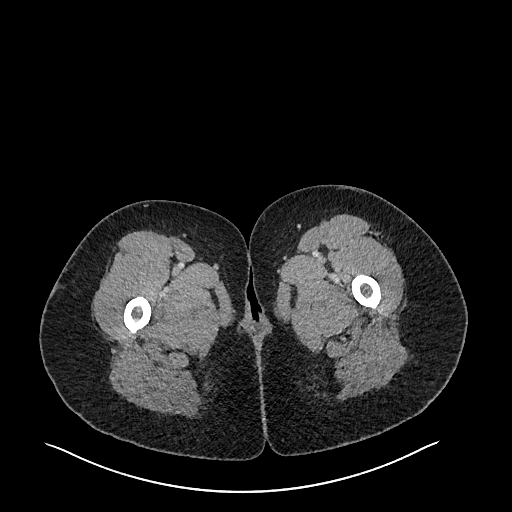
[im 11/264  bone]
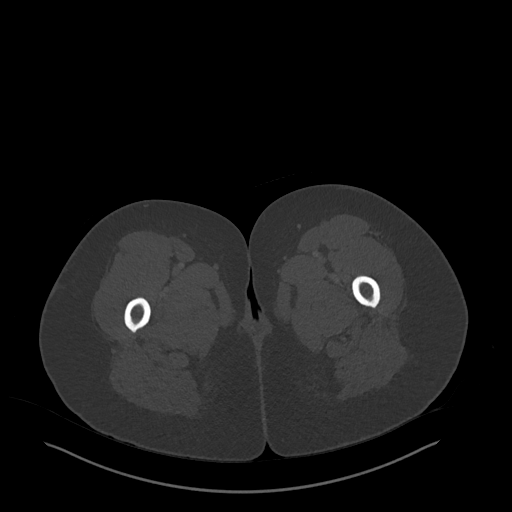
[im 31/264  soft-tissue]
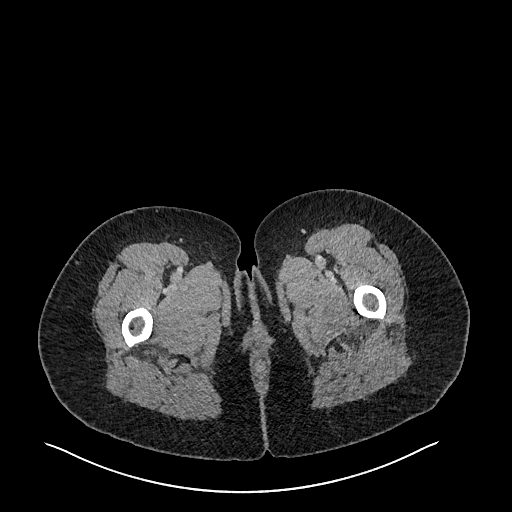
[im 51/264  soft-tissue]
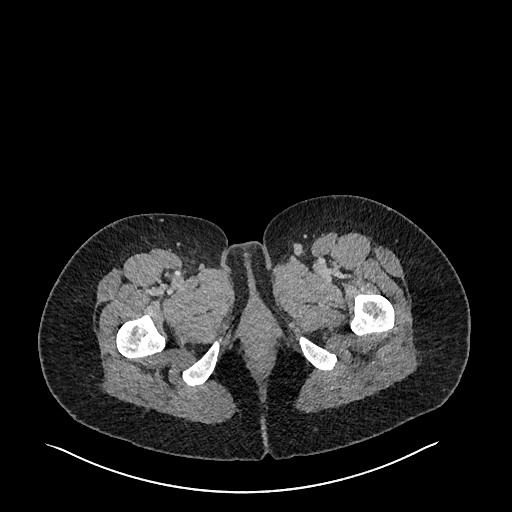
[im 71/264  soft-tissue]
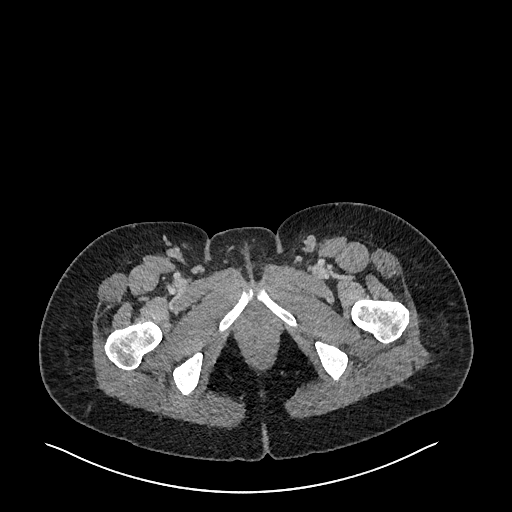
[im 92/264  soft-tissue]
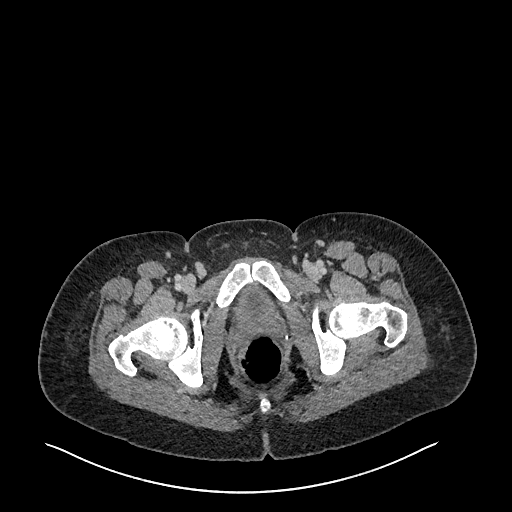
[im 112/264  soft-tissue]
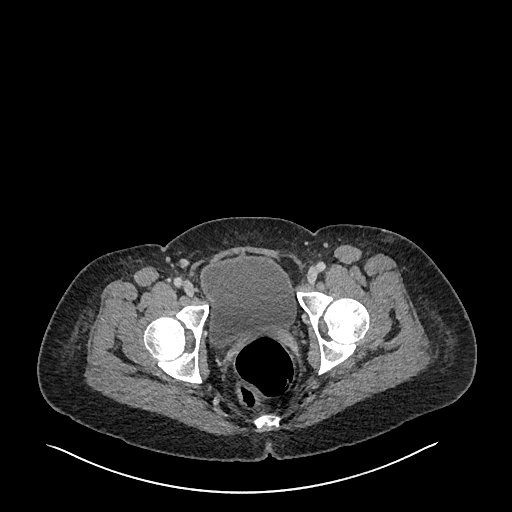
[im 132/264  soft-tissue]
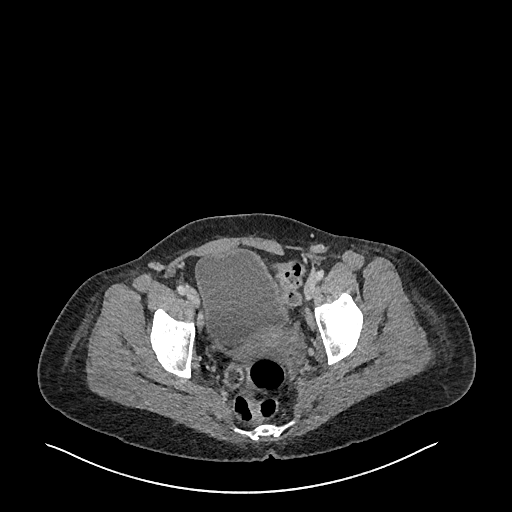
[im 152/264  soft-tissue]
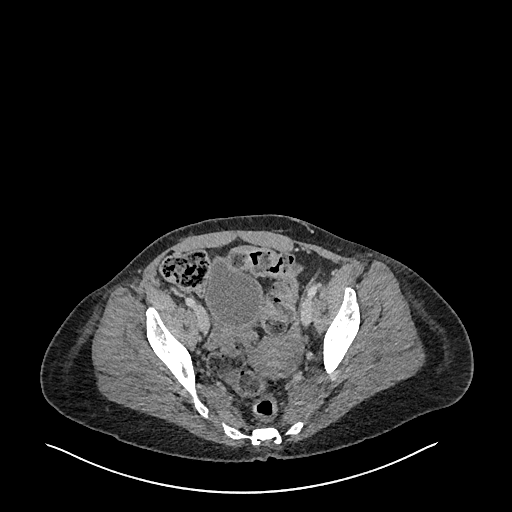
[im 172/264  soft-tissue]
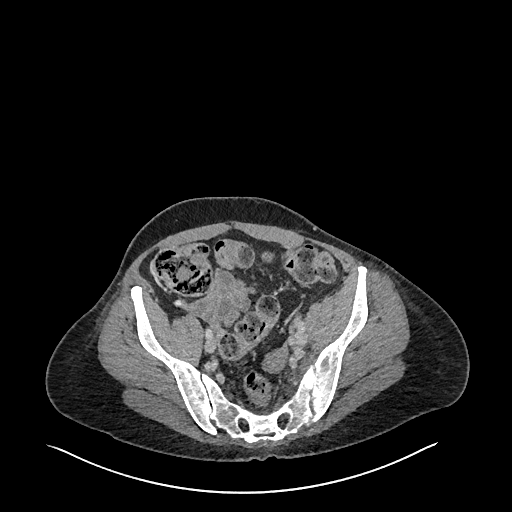
[im 172/264  bone]
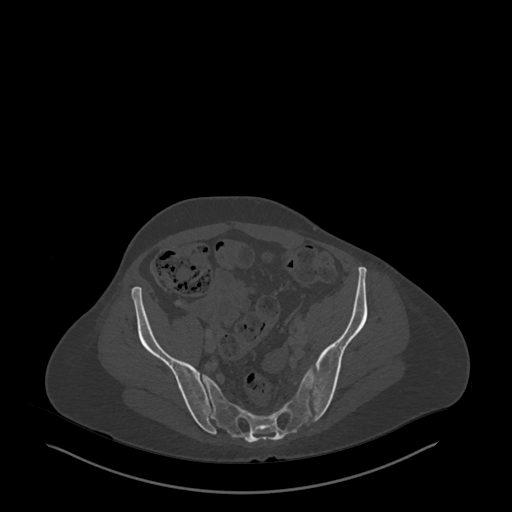
[im 193/264  soft-tissue]
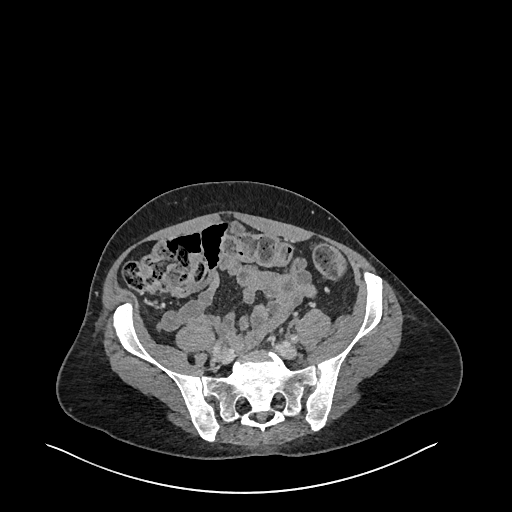
[im 213/264  soft-tissue]
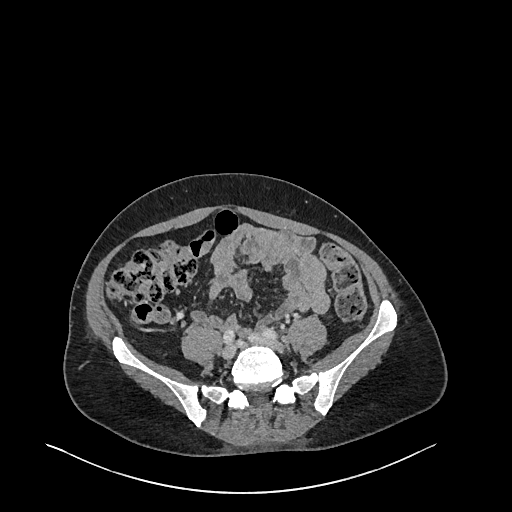
[im 233/264  soft-tissue]
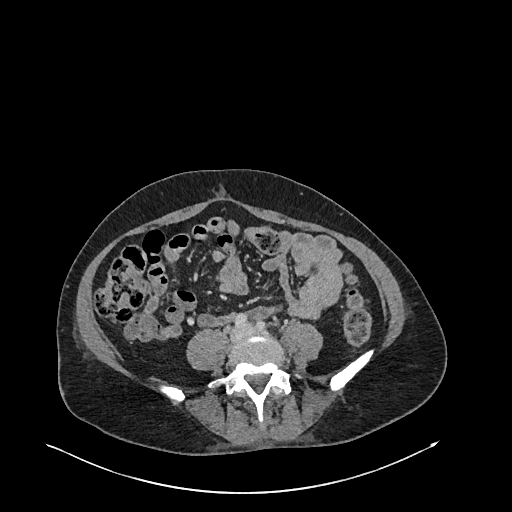
[im 253/264  soft-tissue]
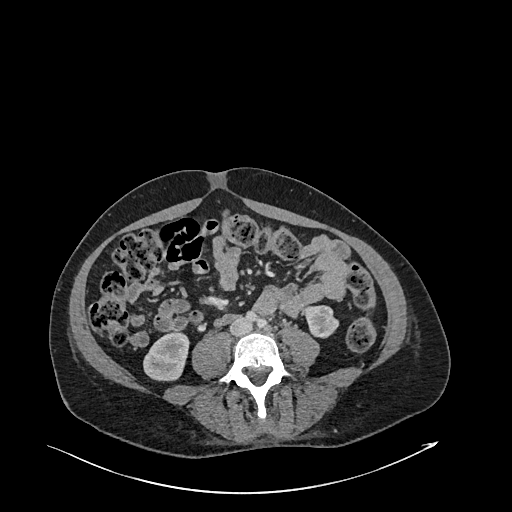

[Series 5: pelvis with 2.0 cor · coronal · 0.55mm/px · 3 of 151 slices shown]
[im 51/151  soft-tissue]
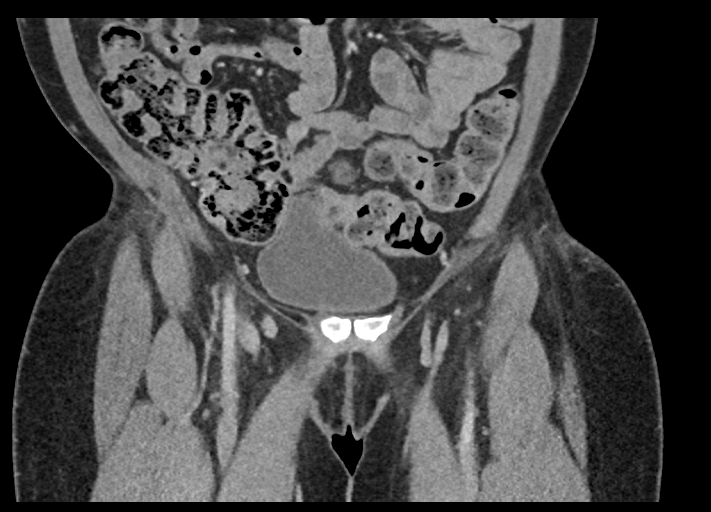
[im 67/151  soft-tissue]
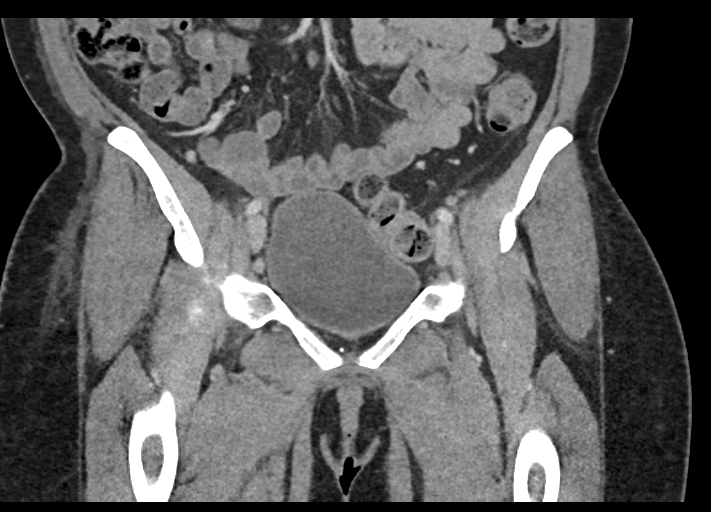
[im 84/151  soft-tissue]
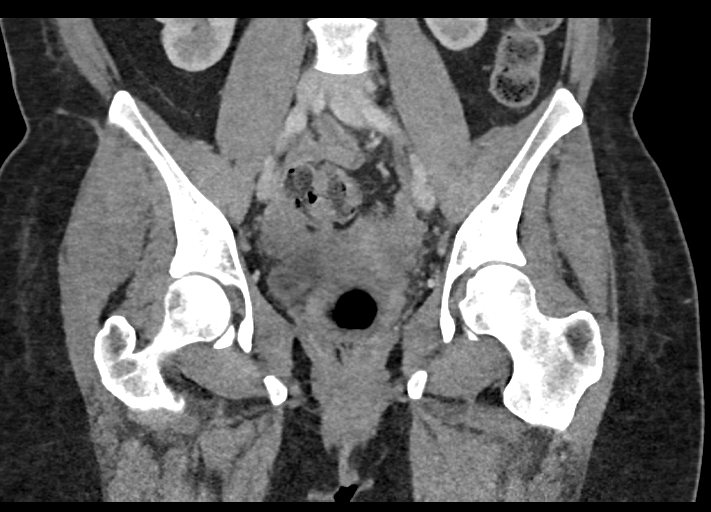

[16 of 46 positions shown; findings below may reference images not displayed]

FINDINGS: Urinary Tract:  No abnormality visualized.

Bowel:  Unremarkable visualized pelvic bowel loops.

Vascular/Lymphatic: No pathologically enlarged lymph nodes. No
significant vascular abnormality seen.

Reproductive:  Uterus and adnexal regions are unremarkable.

Other:  No hernia or abnormal fluid collection is noted.

Musculoskeletal: No suspicious bone lesions identified. Visualized
joint spaces are unremarkable.
IMPRESSION: No definite abnormality seen in the pelvis.

## 2019-10-28 MED FILL — DOCUSATE SODIUM 100 MG CAPS: 100 | 5 days supply | Qty: 10 | Fill #0

## 2019-10-28 MED FILL — hydrOXYzine HCL 25 MG TABS: 25 | 10 days supply | Qty: 60 | Fill #0

## 2019-10-28 MED FILL — FERROUS SULFATE 325 MG TAB: 325 (65 FE) | 30 days supply | Qty: 60 | Fill #0

## 2019-10-28 MED FILL — GABAPENTIN 300 MG CAPSULE: 300 | 15 days supply | Qty: 15 | Fill #0

## 2019-10-28 MED FILL — POLYETHYLENE GLYCOL 3350 PO: 17 | 7 days supply | Qty: 238 | Fill #0

## 2019-10-28 MED FILL — levoFLOXacin 750 MG TABS: 750 | 30 days supply | Qty: 30 | Fill #0
